# Patient Record
Sex: Female | Born: 1985 | Race: White | Hispanic: No | Marital: Married | Smoking: Former smoker
Health system: Southern US, Community
[De-identification: ages and names within clinical notes are randomized; demographics above are authoritative.]

## PROBLEM LIST (undated history)

## (undated) ENCOUNTER — Inpatient Hospital Stay (HOSPITAL_COMMUNITY): Payer: Self-pay

## (undated) DIAGNOSIS — N200 Calculus of kidney: Secondary | ICD-10-CM

## (undated) DIAGNOSIS — Z3490 Encounter for supervision of normal pregnancy, unspecified, unspecified trimester: Secondary | ICD-10-CM

## (undated) HISTORY — PX: NO PAST SURGERIES: SHX2092

## (undated) HISTORY — DX: Calculus of kidney: N20.0

## (undated) HISTORY — DX: Encounter for supervision of normal pregnancy, unspecified, unspecified trimester: Z34.90

---

## 2009-03-25 DIAGNOSIS — N200 Calculus of kidney: Secondary | ICD-10-CM

## 2009-03-25 HISTORY — DX: Calculus of kidney: N20.0

## 2010-05-03 ENCOUNTER — Emergency Department (HOSPITAL_COMMUNITY): Payer: Self-pay

## 2010-05-03 ENCOUNTER — Emergency Department (HOSPITAL_COMMUNITY)
Admission: EM | Admit: 2010-05-03 | Discharge: 2010-05-03 | Disposition: A | Discharge status: Home / Self Care | Payer: Self-pay | Attending: Emergency Medicine | Admitting: Emergency Medicine

## 2010-05-03 DIAGNOSIS — N201 Calculus of ureter: Secondary | ICD-10-CM | POA: Insufficient documentation

## 2010-05-03 DIAGNOSIS — R10813 Right lower quadrant abdominal tenderness: Secondary | ICD-10-CM | POA: Insufficient documentation

## 2010-05-03 DIAGNOSIS — R1031 Right lower quadrant pain: Secondary | ICD-10-CM | POA: Insufficient documentation

## 2010-05-03 DIAGNOSIS — R112 Nausea with vomiting, unspecified: Secondary | ICD-10-CM | POA: Insufficient documentation

## 2010-05-03 LAB — DIFFERENTIAL
Basophils Relative: 1 % (ref 0–1)
Lymphocytes Relative: 39 % (ref 12–46)
Monocytes Absolute: 0.5 10*3/uL (ref 0.1–1.0)
Monocytes Relative: 5 % (ref 3–12)
Neutro Abs: 5 10*3/uL (ref 1.7–7.7)

## 2010-05-03 LAB — URINALYSIS, ROUTINE W REFLEX MICROSCOPIC
Bilirubin Urine: NEGATIVE
Protein, ur: 30 mg/dL — AB
Urobilinogen, UA: 0.2 mg/dL (ref 0.0–1.0)

## 2010-05-03 LAB — BASIC METABOLIC PANEL
BUN: 11 mg/dL (ref 6–23)
CO2: 20 mEq/L (ref 19–32)
Calcium: 9.7 mg/dL (ref 8.4–10.5)
Creatinine, Ser: 0.87 mg/dL (ref 0.4–1.2)
Glucose, Bld: 95 mg/dL (ref 70–99)

## 2010-05-03 LAB — CBC
HCT: 40.6 % (ref 36.0–46.0)
Hemoglobin: 14.4 g/dL (ref 12.0–15.0)
MCH: 31.2 pg (ref 26.0–34.0)
MCHC: 35.5 g/dL (ref 30.0–36.0)
MCV: 88.1 fL (ref 78.0–100.0)

## 2010-05-03 LAB — PREGNANCY, URINE: Preg Test, Ur: NEGATIVE

## 2012-09-11 IMAGING — CT CT ABD-PELV W/O CM
2 of 4 series · 16 of 46 positions shown, 18 images · non-contrast
Comparison: None.

CLINICAL DATA: Abdominal pain, vomiting, rule out stone, rule out
appendicitis

CT ABDOMEN AND PELVIS WITHOUT CONTRAST
TECHNIQUE: Multidetector CT imaging of the abdomen and pelvis was
performed following the standard protocol without intravenous
contrast.

[Series 2: abd|pel w/o 5.0 b40f · axial · non-contrast · 0.66mm/px · z∈[-474,-98]mm · 13 of 83 slices shown, 15 images]
[im 4/83  soft-tissue]
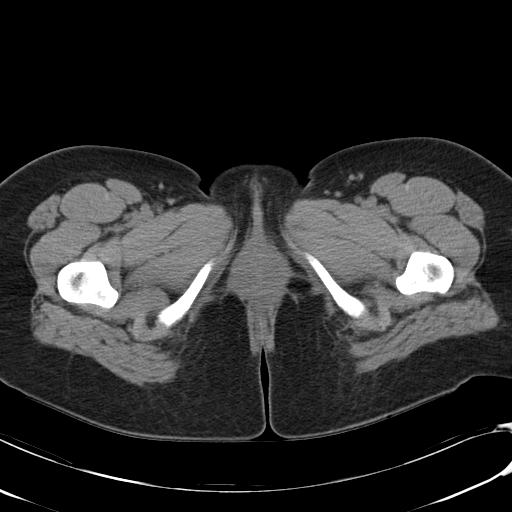
[im 4/83  bone]
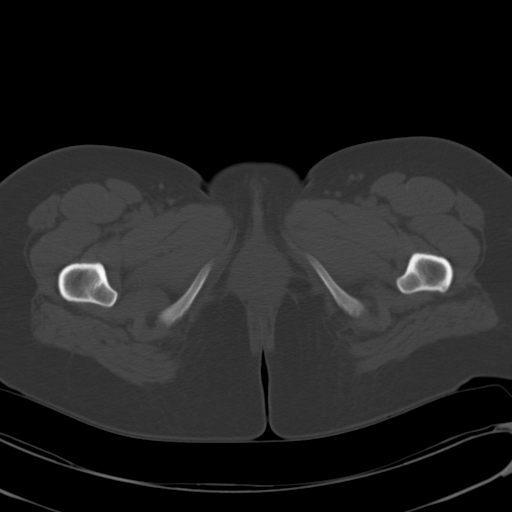
[im 10/83  soft-tissue]
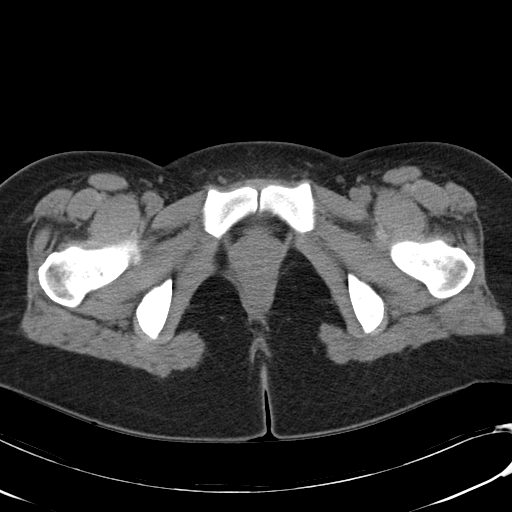
[im 17/83  soft-tissue]
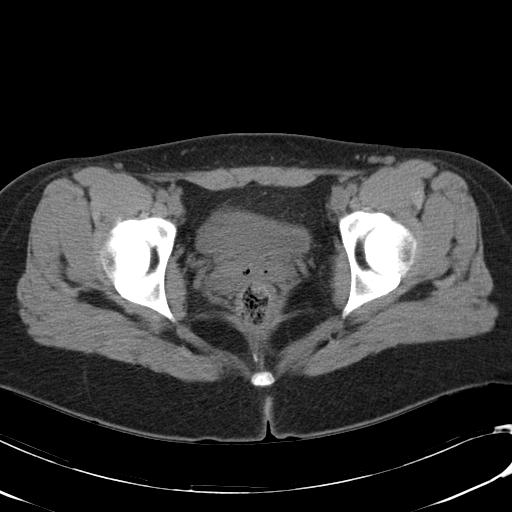
[im 23/83  soft-tissue]
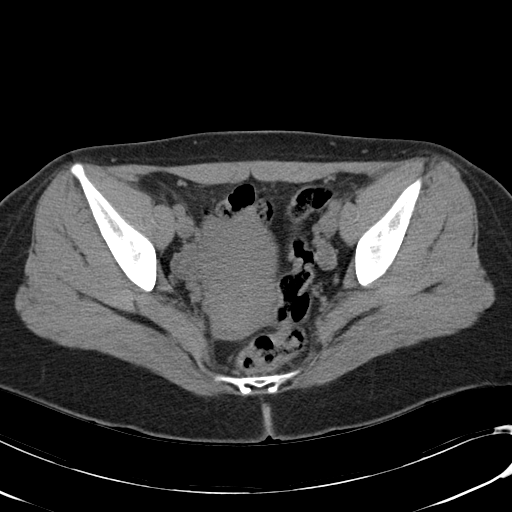
[im 30/83  soft-tissue]
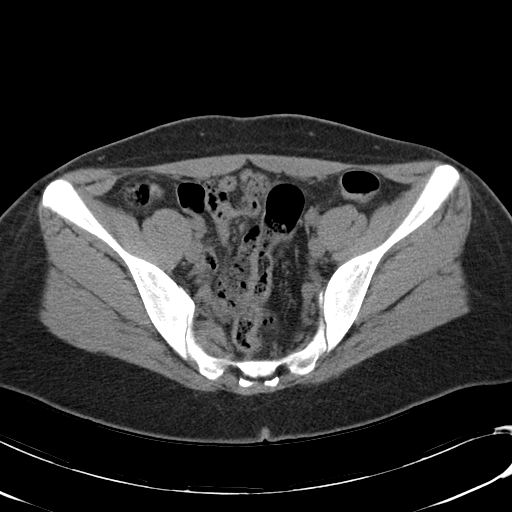
[im 37/83  soft-tissue]
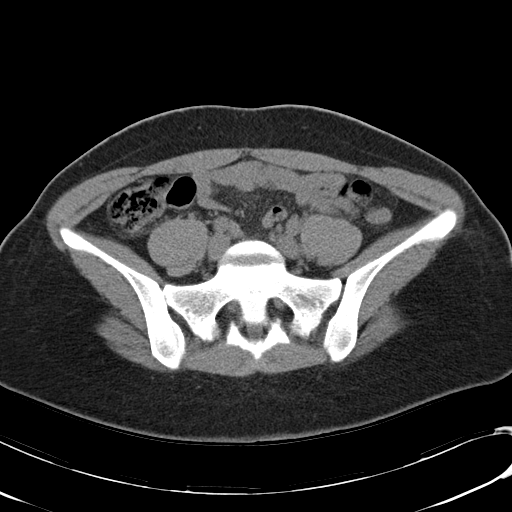
[im 43/83  soft-tissue]
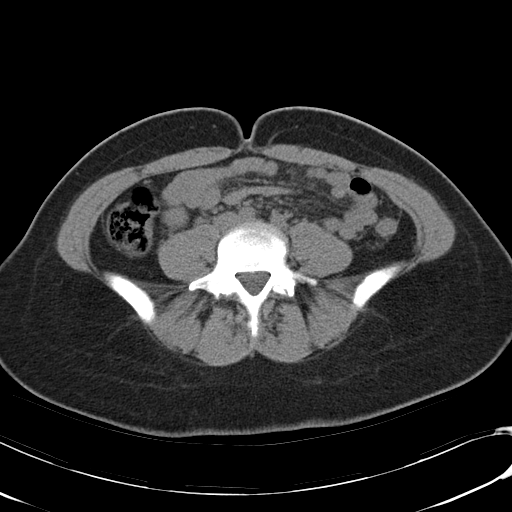
[im 46/83  soft-tissue]
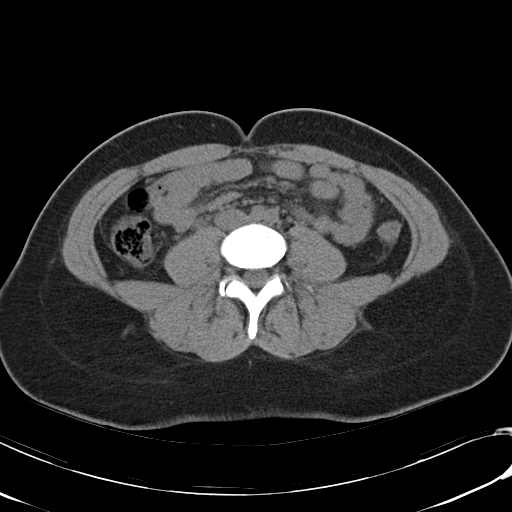
[im 53/83  soft-tissue]
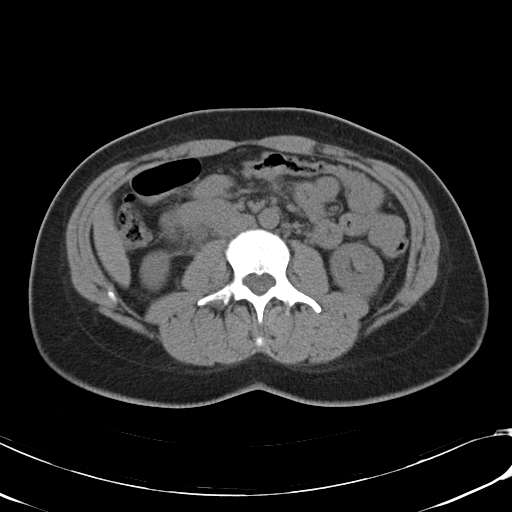
[im 53/83  bone]
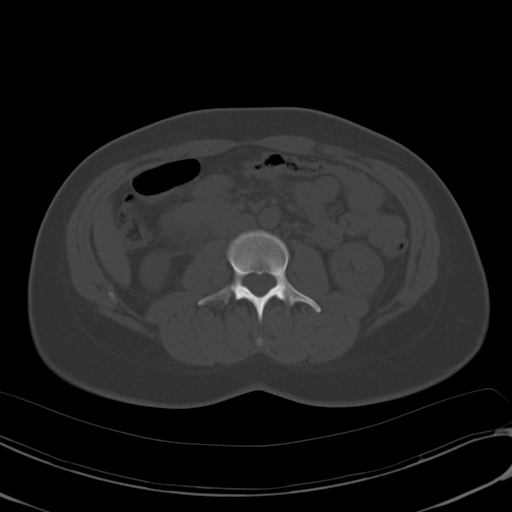
[im 60/83  soft-tissue]
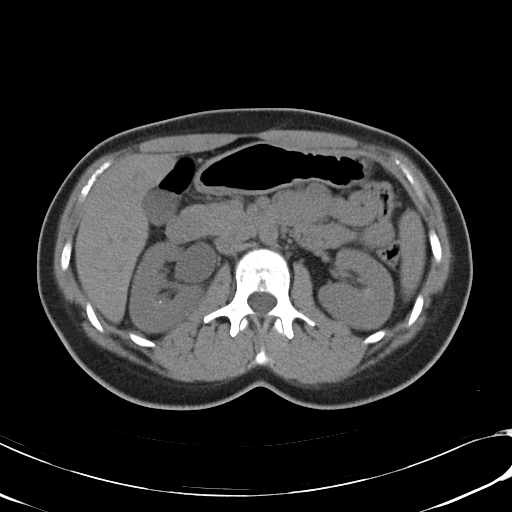
[im 66/83  soft-tissue]
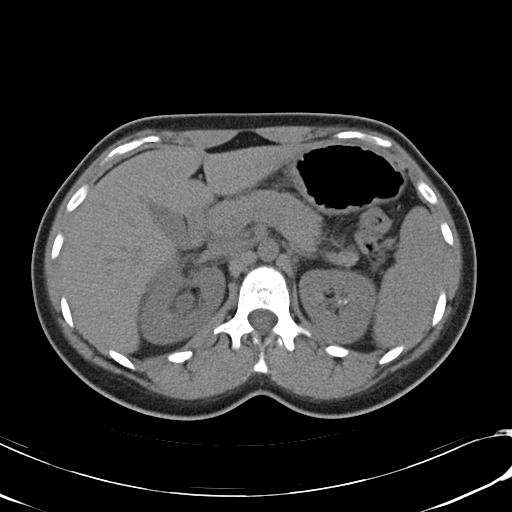
[im 73/83  soft-tissue]
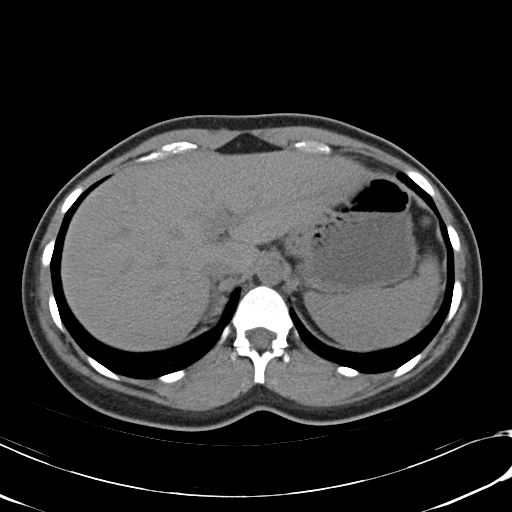
[im 79/83  soft-tissue]
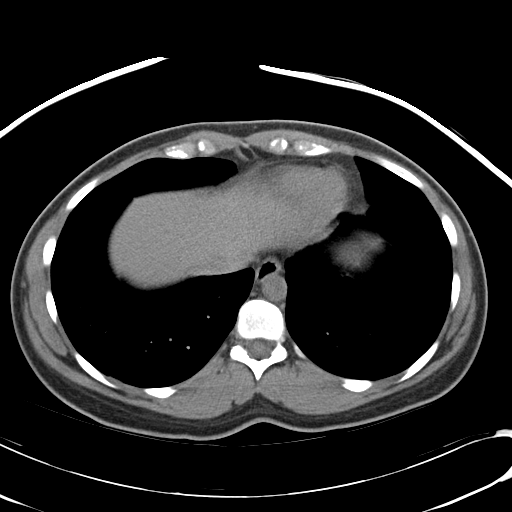

[Series 4: mpr cor (id) · coronal · 0.77mm/px · 3 of 67 slices shown]
[im 23/67  soft-tissue]
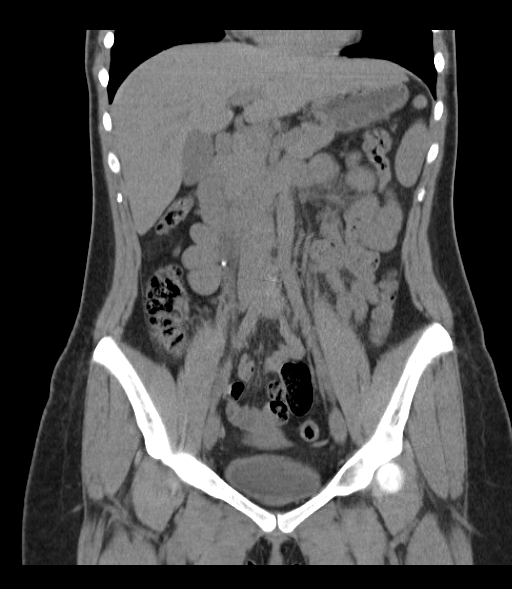
[im 30/67  soft-tissue]
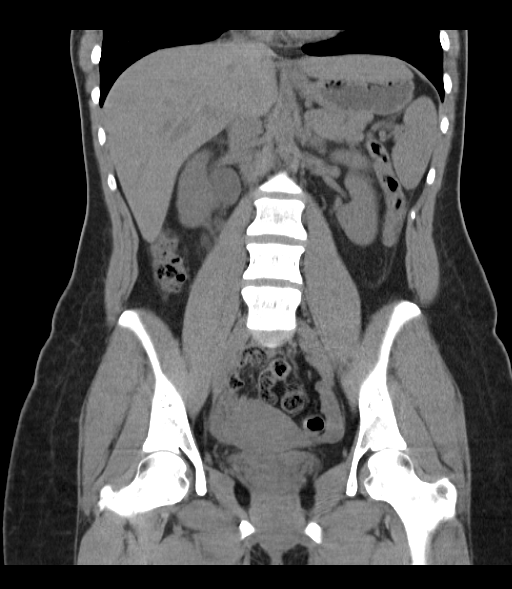
[im 37/67  soft-tissue]
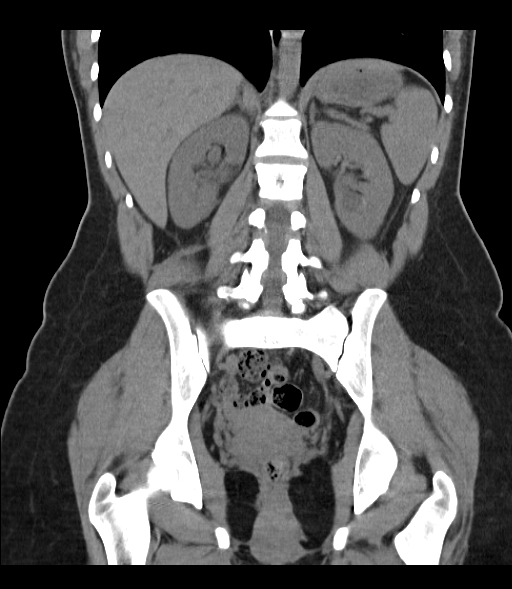

[16 of 46 positions shown; findings below may reference images not displayed]

FINDINGS: Lung bases are unremarkable.

No calcified gallstones are noted within gallbladder.

Sagittal images of the spine are unremarkable.

Unenhanced liver, spleen, pancreas and adrenals are unremarkable.
Multiple punctate nonobstructive right renal calculi are noted the
largest in mid pole measures 2 mm.  At least  two nonobstructive
calcified calculi are noted in the upper pole of the left kidney
the largest measures 1.9 mm.

There is mild right hydronephrosis and proximal right hydroureter.
There is mild right periureteral stranding.  In axial image 37
there is a 5 mm calcified obstructive calculus in mid right ureter
at the level of the mid aspect of L4 vertebral body.

Bilateral distal ureter is unremarkable.

No small bowel obstruction.  No ascites or free air.  No
adenopathy.

Normal appendix is clearly visualized in axial image 56.  The
appendix has a anterior position in right pelvis.

The unenhanced uterus and adnexa are unremarkable.  The urinary
bladder is under distended grossly unremarkable.  No destructive
bony lesions are noted within pelvis.  No pelvic ascites or
adenopathy.
IMPRESSION: 1.  Bilateral nonobstructive nephrolithiasis.  Mild right
hydronephrosis and proximal right hydroureter.
2.  There is  5 mm calcified obstructive calculus in mid right
ureter at L4 vertebral body level.
3.  Normal appendix is clearly visualized.
4.  Unremarkable distal ureter and urinary bladder.

## 2015-08-01 ENCOUNTER — Encounter: Payer: Self-pay | Admitting: Adult Health

## 2015-08-01 ENCOUNTER — Ambulatory Visit (INDEPENDENT_AMBULATORY_CARE_PROVIDER_SITE_OTHER): Payer: BLUE CROSS/BLUE SHIELD | Admitting: Adult Health

## 2015-08-01 VITALS — BP 100/60 | HR 70 | Ht 62.0 in | Wt 170.0 lb

## 2015-08-01 DIAGNOSIS — Z3201 Encounter for pregnancy test, result positive: Secondary | ICD-10-CM | POA: Diagnosis not present

## 2015-08-01 DIAGNOSIS — Z3490 Encounter for supervision of normal pregnancy, unspecified, unspecified trimester: Secondary | ICD-10-CM

## 2015-08-01 DIAGNOSIS — N926 Irregular menstruation, unspecified: Secondary | ICD-10-CM

## 2015-08-01 DIAGNOSIS — O3680X0 Pregnancy with inconclusive fetal viability, not applicable or unspecified: Secondary | ICD-10-CM

## 2015-08-01 HISTORY — DX: Encounter for supervision of normal pregnancy, unspecified, unspecified trimester: Z34.90

## 2015-08-01 LAB — POCT URINE PREGNANCY: Preg Test, Ur: POSITIVE — AB

## 2015-08-01 MED ORDER — PRENATAL PLUS 27-1 MG PO TABS
1.0000 | ORAL_TABLET | Freq: Every day | ORAL | Status: DC
Start: 2015-08-01 — End: 2016-04-07

## 2015-08-01 NOTE — Progress Notes (Signed)
Subjective:     Patient ID: Anne Marshall, female   DOB: 04/29/1985, 30 y.o.   MRN: [id number]  HPI Anne Marshall is a 30 year old white female, married in for UPT, has missed a period and had +HPT, has some nausea today, denies any bleeding or vomiting or pain, has + breast tenderness, she has 29 year old daughter at home.  Review of Systems Patient denies any headaches, hearing loss, fatigue, blurred vision, shortness of breath, chest pain, abdominal pain, problems with bowel movements, urination, or intercourse. No joint pain or mood swings. See HPI for positives. Reviewed past medical,surgical, social and family history. Reviewed medications and allergies.     Objective:   Physical Exam BP 100/60 mmHg  Pulse 70  Ht 5\' 2"  (1.575 m)  Wt 170 lb (77.111 kg)  BMI 31.09 kg/m2  LMP 06/22/2015 UPT +, about 5+[redacted] weeks pregnant by LMP with EDD 03/28/16, Skin warm and dry. Neck: mid line trachea, normal thyroid, good ROM, no lymphadenopathy noted. Lungs: clear to ausculation bilaterally. Cardiovascular: regular rate and rhythm. Abdomen is soft and non tender, no HSM.Medicaid form given.    Assessment:     UPT + Pregnant     Plan:    Eat often Rx prenatal plus #30 take 1 daily with 11 refills Return in 9 days for dating US Review handout on first trimester

## 2015-08-01 NOTE — Patient Instructions (Signed)
First Trimester of Pregnancy The first trimester of pregnancy is from week 1 until the end of week 12 (months 1 through 3). A week after a sperm fertilizes an egg, the egg will implant on the wall of the uterus. This embryo will begin to develop into a baby. Genes from you and your partner are forming the baby. The female genes determine whether the baby is a boy or a girl. At 6-8 weeks, the eyes and face are formed, and the heartbeat can be seen on ultrasound. At the end of 12 weeks, all the baby's organs are formed.  Now that you are pregnant, you will want to do everything you can to have a healthy baby. Two of the most important things are to get good prenatal care and to follow your health care provider's instructions. Prenatal care is all the medical care you receive before the baby's birth. This care will help prevent, find, and treat any problems during the pregnancy and childbirth. BODY CHANGES Your body goes through many changes during pregnancy. The changes vary from woman to woman.   You may gain or lose a couple of pounds at first.  You may feel sick to your stomach (nauseous) and throw up (vomit). If the vomiting is uncontrollable, call your health care provider.  You may tire easily.  You may develop headaches that can be relieved by medicines approved by your health care provider.  You may urinate more often. Painful urination may mean you have a bladder infection.  You may develop heartburn as a result of your pregnancy.  You may develop constipation because certain hormones are causing the muscles that push waste through your intestines to slow down.  You may develop hemorrhoids or swollen, bulging veins (varicose veins).  Your breasts may begin to grow larger and become tender. Your nipples may stick out more, and the tissue that surrounds them (areola) may become darker.  Your gums may bleed and may be sensitive to brushing and flossing.  Dark spots or blotches (chloasma,  mask of pregnancy) may develop on your face. This will likely fade after the baby is born.  Your menstrual periods will stop.  You may have a loss of appetite.  You may develop cravings for certain kinds of food.  You may have changes in your emotions from day to day, such as being excited to be pregnant or being concerned that something may go wrong with the pregnancy and baby.  You may have more vivid and strange dreams.  You may have changes in your hair. These can include thickening of your hair, rapid growth, and changes in texture. Some women also have hair loss during or after pregnancy, or hair that feels dry or thin. Your hair will most likely return to normal after your baby is born. WHAT TO EXPECT AT YOUR PRENATAL VISITS During a routine prenatal visit:  You will be weighed to make sure you and the baby are growing normally.  Your blood pressure will be taken.  Your abdomen will be measured to track your baby's growth.  The fetal heartbeat will be listened to starting around week 10 or 12 of your pregnancy.  Test results from any previous visits will be discussed. Your health care provider may ask you:  How you are feeling.  If you are feeling the baby move.  If you have had any abnormal symptoms, such as leaking fluid, bleeding, severe headaches, or abdominal cramping.  If you are using any tobacco products,   including cigarettes, chewing tobacco, and electronic cigarettes.  If you have any questions. Other tests that may be performed during your first trimester include:  Blood tests to find your blood type and to check for the presence of any previous infections. They will also be used to check for low iron levels (anemia) and Rh antibodies. Later in the pregnancy, blood tests for diabetes will be done along with other tests if problems develop.  Urine tests to check for infections, diabetes, or protein in the urine.  An ultrasound to confirm the proper growth  and development of the baby.  An amniocentesis to check for possible genetic problems.  Fetal screens for spina bifida and Down syndrome.  You may need other tests to make sure you and the baby are doing well.  HIV (human immunodeficiency virus) testing. Routine prenatal testing includes screening for HIV, unless you choose not to have this test. HOME CARE INSTRUCTIONS  Medicines  Follow your health care provider's instructions regarding medicine use. Specific medicines may be either safe or unsafe to take during pregnancy.  Take your prenatal vitamins as directed.  If you develop constipation, try taking a stool softener if your health care provider approves. Diet  Eat regular, well-balanced meals. Choose a variety of foods, such as meat or vegetable-based protein, fish, milk and low-fat dairy products, vegetables, fruits, and whole grain breads and cereals. Your health care provider will help you determine the amount of weight gain that is right for you.  Avoid raw meat and uncooked cheese. These carry germs that can cause birth defects in the baby.  Eating four or five small meals rather than three large meals a day may help relieve nausea and vomiting. If you start to feel nauseous, eating a few soda crackers can be helpful. Drinking liquids between meals instead of during meals also seems to help nausea and vomiting.  If you develop constipation, eat more high-fiber foods, such as fresh vegetables or fruit and whole grains. Drink enough fluids to keep your urine clear or pale yellow. Activity and Exercise  Exercise only as directed by your health care provider. Exercising will help you:  Control your weight.  Stay in shape.  Be prepared for labor and delivery.  Experiencing pain or cramping in the lower abdomen or low back is a good sign that you should stop exercising. Check with your health care provider before continuing normal exercises.  Try to avoid standing for long  periods of time. Move your legs often if you must stand in one place for a long time.  Avoid heavy lifting.  Wear low-heeled shoes, and practice good posture.  You may continue to have sex unless your health care provider directs you otherwise. Relief of Pain or Discomfort  Wear a good support bra for breast tenderness.   Take warm sitz baths to soothe any pain or discomfort caused by hemorrhoids. Use hemorrhoid cream if your health care provider approves.   Rest with your legs elevated if you have leg cramps or low back pain.  If you develop varicose veins in your legs, wear support hose. Elevate your feet for 15 minutes, 3-4 times a day. Limit salt in your diet. Prenatal Care  Schedule your prenatal visits by the twelfth week of pregnancy. They are usually scheduled monthly at first, then more often in the last 2 months before delivery.  Write down your questions. Take them to your prenatal visits.  Keep all your prenatal visits as directed by your   health care provider. Safety  Wear your seat belt at all times when driving.  Make a list of emergency phone numbers, including numbers for family, friends, the hospital, and police and fire departments. General Tips  Ask your health care provider for a referral to a local prenatal education class. Begin classes no later than at the beginning of month 6 of your pregnancy.  Ask for help if you have counseling or nutritional needs during pregnancy. Your health care provider can offer advice or refer you to specialists for help with various needs.  Do not use hot tubs, steam rooms, or saunas.  Do not douche or use tampons or scented sanitary pads.  Do not cross your legs for long periods of time.  Avoid cat litter boxes and soil used by cats. These carry germs that can cause birth defects in the baby and possibly loss of the fetus by miscarriage or stillbirth.  Avoid all smoking, herbs, alcohol, and medicines not prescribed by  your health care provider. Chemicals in these affect the formation and growth of the baby.  Do not use any tobacco products, including cigarettes, chewing tobacco, and electronic cigarettes. If you need help quitting, ask your health care provider. You may receive counseling support and other resources to help you quit.  Schedule a dentist appointment. At home, brush your teeth with a soft toothbrush and be gentle when you floss. SEEK MEDICAL CARE IF:   You have dizziness.  You have mild pelvic cramps, pelvic pressure, or nagging pain in the abdominal area.  You have persistent nausea, vomiting, or diarrhea.  You have a bad smelling vaginal discharge.  You have pain with urination.  You notice increased swelling in your face, hands, legs, or ankles. SEEK IMMEDIATE MEDICAL CARE IF:   You have a fever.  You are leaking fluid from your vagina.  You have spotting or bleeding from your vagina.  You have severe abdominal cramping or pain.  You have rapid weight gain or loss.  You vomit blood or material that looks like coffee grounds.  You are exposed to [place] measles and have never had them.  You are exposed to fifth disease or chickenpox.  You develop a severe headache.  You have shortness of breath.  You have any kind of trauma, such as from a fall or a car accident.   This information is not intended to replace advice given to you by your health care provider. Make sure you discuss any questions you have with your health care provider.   Document Released: 03/05/2001 Document Revised: 04/01/2014 Document Reviewed: 01/19/2013 Elsevier Interactive Patient Education [employer]2016 Elsevier Inc. Return in 9 days for dating Eat often

## 2015-08-10 ENCOUNTER — Ambulatory Visit (INDEPENDENT_AMBULATORY_CARE_PROVIDER_SITE_OTHER): Payer: BLUE CROSS/BLUE SHIELD

## 2015-08-10 DIAGNOSIS — O3680X0 Pregnancy with inconclusive fetal viability, not applicable or unspecified: Secondary | ICD-10-CM | POA: Diagnosis not present

## 2015-08-10 DIAGNOSIS — Z3A01 Less than 8 weeks gestation of pregnancy: Secondary | ICD-10-CM

## 2015-08-10 NOTE — Progress Notes (Signed)
US 7 wks,single IUP w/ys,pos fht 116 bpm,normal rt ov,simple lt corpus luteal cyst 2.1 x 1.9 x 1.6 cm

## 2015-08-23 ENCOUNTER — Encounter: Payer: BLUE CROSS/BLUE SHIELD | Admitting: Advanced Practice Midwife

## 2015-09-06 ENCOUNTER — Ambulatory Visit (INDEPENDENT_AMBULATORY_CARE_PROVIDER_SITE_OTHER): Payer: BLUE CROSS/BLUE SHIELD | Admitting: Advanced Practice Midwife

## 2015-09-06 ENCOUNTER — Encounter: Payer: Self-pay | Admitting: Advanced Practice Midwife

## 2015-09-06 VITALS — BP 90/60 | HR 80 | Wt 170.0 lb

## 2015-09-06 DIAGNOSIS — Z3491 Encounter for supervision of normal pregnancy, unspecified, first trimester: Secondary | ICD-10-CM

## 2015-09-06 DIAGNOSIS — Z331 Pregnant state, incidental: Secondary | ICD-10-CM

## 2015-09-06 DIAGNOSIS — Z029 Encounter for administrative examinations, unspecified: Secondary | ICD-10-CM

## 2015-09-06 DIAGNOSIS — Z0283 Encounter for blood-alcohol and blood-drug test: Secondary | ICD-10-CM

## 2015-09-06 DIAGNOSIS — Z369 Encounter for antenatal screening, unspecified: Secondary | ICD-10-CM

## 2015-09-06 DIAGNOSIS — Z1389 Encounter for screening for other disorder: Secondary | ICD-10-CM

## 2015-09-06 DIAGNOSIS — Z3490 Encounter for supervision of normal pregnancy, unspecified, unspecified trimester: Secondary | ICD-10-CM | POA: Insufficient documentation

## 2015-09-06 LAB — POCT URINALYSIS DIPSTICK
GLUCOSE UA: NEGATIVE
KETONES UA: NEGATIVE
Leukocytes, UA: NEGATIVE
NITRITE UA: NEGATIVE

## 2015-09-06 NOTE — Progress Notes (Signed)
  Subjective:    Anne Marshall is a G2P1 7263w6d being seen today for her first obstetrical visit.  Her obstetrical history is significant for term SVD w/o complications.  Pregnancy history fully reviewed. She had elective IOL.  Patient reports no complaints.  Filed Vitals:   09/06/15 0857  BP: 90/60  Pulse: 80  Weight: 170 lb (77.111 kg)    HISTORY: OB History  Gravida Para Term Preterm AB SAB TAB Ectopic Multiple Living  2 1        1     # Outcome Date GA Lbr Len/2nd Weight Sex Delivery Anes PTL Lv  2 Current           1 Para              Past Medical History  Diagnosis Date  . Pregnant 08/01/2015   History reviewed. No pertinent past surgical history. Family History  Problem Relation Age of Onset  . Diabetes Father      Exam                                      System:     Skin: normal coloration and turgor, no rashes    Neurologic: oriented, normal, normal mood   Extremities: normal strength, tone, and muscle mass   HEENT PERRLA   Mouth/Teeth mucous membranes moist, poor dentition   Neck supple and no masses   Cardiovascular: regular rate and rhythm   Respiratory:  appears well, vitals normal, no respiratory distress, acyanotic   Abdomen: soft, non-tender;  FHR: 150          Assessment:    Pregnancy: G2P1 Patient Active Problem List   Diagnosis Date Noted  .    09/06/2015  . Pregnant 08/01/2015        Plan:     Initial labs drawn. Continue prenatal vitamins  Problem list reviewed and updated  Reviewed n/v relief measures and warning s/s to report  Reviewed recommended weight gain based on pre-gravid BMI  Encouraged well-balanced diet Genetic Screening discussed Integrated Screen: requested.  Ultrasound discussed; fetal survey: requested.  Return in about 2 weeks (around 09/20/2015) for LROB, US:NT+1st IT.  CRESENZO-DISHMAN,Sigmond Patalano 09/06/2015

## 2015-09-07 LAB — URINE CULTURE

## 2015-09-08 LAB — PMP SCREEN PROFILE (10S), URINE
AMPHETAMINE SCRN UR: NEGATIVE ng/mL
Barbiturate Screen, Ur: NEGATIVE ng/mL
Benzodiazepine Screen, Urine: NEGATIVE ng/mL
COCAINE(METAB.) SCREEN, URINE: NEGATIVE ng/mL
CREATININE(CRT), U: 225.9 mg/dL (ref 20.0–300.0)
Cannabinoids Ur Ql Scn: NEGATIVE ng/mL
METHADONE SCREEN, URINE: NEGATIVE ng/mL
OPIATE SCRN UR: NEGATIVE ng/mL
OXYCODONE+OXYMORPHONE UR QL SCN: NEGATIVE ng/mL
PCP SCRN UR: NEGATIVE ng/mL
PROPOXYPHENE SCREEN: NEGATIVE ng/mL
Ph of Urine: 5.8 (ref 4.5–8.9)

## 2015-09-08 LAB — HEPATITIS B SURFACE ANTIGEN: Hepatitis B Surface Ag: NEGATIVE

## 2015-09-08 LAB — RUBELLA SCREEN: Rubella Antibodies, IGG: 1.08 index (ref >0.99)

## 2015-09-08 LAB — CBC
HEMOGLOBIN: 11.9 g/dL (ref 11.1–15.9)
Hematocrit: 35.5 % (ref 34.0–46.6)
MCH: 29.4 pg (ref 26.6–33.0)
MCHC: 33.5 g/dL (ref 31.5–35.7)
MCV: 88 fL (ref 79–97)
Platelets: 244 10*3/uL (ref 150–379)
RBC: 4.05 x10E6/uL (ref 3.77–5.28)
RDW: 14.2 % (ref 12.3–15.4)
WBC: 9.8 10*3/uL (ref 3.4–10.8)

## 2015-09-08 LAB — URINALYSIS, ROUTINE W REFLEX MICROSCOPIC
Bilirubin, UA: NEGATIVE
GLUCOSE, UA: NEGATIVE
Ketones, UA: NEGATIVE
LEUKOCYTES UA: NEGATIVE
Nitrite, UA: NEGATIVE
PROTEIN UA: NEGATIVE
RBC, UA: NEGATIVE
SPEC GRAV UA: 1.026 (ref 1.005–1.030)
Urobilinogen, Ur: 0.2 mg/dL (ref 0.2–1.0)
pH, UA: 6 (ref 5.0–7.5)

## 2015-09-08 LAB — ABO/RH: RH TYPE: POSITIVE

## 2015-09-08 LAB — HIV ANTIBODY (ROUTINE TESTING W REFLEX): HIV Screen 4th Generation wRfx: NONREACTIVE

## 2015-09-08 LAB — ANTIBODY SCREEN: Antibody Screen: NEGATIVE

## 2015-09-08 LAB — VARICELLA ZOSTER ANTIBODY, IGG: Varicella zoster IgG: 436 index (ref >165)

## 2015-09-08 LAB — RPR: RPR Ser Ql: NONREACTIVE

## 2015-09-19 ENCOUNTER — Other Ambulatory Visit: Payer: Self-pay | Admitting: Advanced Practice Midwife

## 2015-09-19 DIAGNOSIS — Z3682 Encounter for antenatal screening for nuchal translucency: Secondary | ICD-10-CM

## 2015-09-20 ENCOUNTER — Ambulatory Visit (INDEPENDENT_AMBULATORY_CARE_PROVIDER_SITE_OTHER): Payer: BLUE CROSS/BLUE SHIELD

## 2015-09-20 ENCOUNTER — Ambulatory Visit (INDEPENDENT_AMBULATORY_CARE_PROVIDER_SITE_OTHER): Payer: BLUE CROSS/BLUE SHIELD | Admitting: Advanced Practice Midwife

## 2015-09-20 VITALS — BP 104/68 | HR 68 | Wt 168.5 lb

## 2015-09-20 DIAGNOSIS — Z3682 Encounter for antenatal screening for nuchal translucency: Secondary | ICD-10-CM

## 2015-09-20 DIAGNOSIS — Z331 Pregnant state, incidental: Secondary | ICD-10-CM

## 2015-09-20 DIAGNOSIS — Z3A13 13 weeks gestation of pregnancy: Secondary | ICD-10-CM

## 2015-09-20 DIAGNOSIS — Z1389 Encounter for screening for other disorder: Secondary | ICD-10-CM

## 2015-09-20 DIAGNOSIS — Z36 Encounter for antenatal screening of mother: Secondary | ICD-10-CM

## 2015-09-20 DIAGNOSIS — Z3491 Encounter for supervision of normal pregnancy, unspecified, first trimester: Secondary | ICD-10-CM

## 2015-09-20 DIAGNOSIS — Z3490 Encounter for supervision of normal pregnancy, unspecified, unspecified trimester: Secondary | ICD-10-CM

## 2015-09-20 DIAGNOSIS — Z3481 Encounter for supervision of other normal pregnancy, first trimester: Secondary | ICD-10-CM

## 2015-09-20 LAB — POCT URINALYSIS DIPSTICK
GLUCOSE UA: NEGATIVE
Ketones, UA: NEGATIVE
Nitrite, UA: NEGATIVE
RBC UA: NEGATIVE

## 2015-09-20 NOTE — Progress Notes (Signed)
US 12+6 wks,measurements c/w dates,normal ov's bilat,fhr 161 bpm,post pl gr 0,NB present,NT 2.1 mm,crl 62.6 mm

## 2015-09-20 NOTE — Progress Notes (Signed)
G2P1001 395w6d Estimated Date of Delivery: 03/28/16  Blood pressure 104/68, pulse 68, weight 168 lb 8 oz (76.431 kg), last menstrual period 06/22/2015.   BP weight and urine results all reviewed and noted.  Please refer to the obstetrical flow sheet for the fundal height and fetal heart rate documentation: US 12+6 wks,measurements c/w dates,normal ov's bilat,fhr 161 bpm,post pl gr 0,NB present,NT 2.1 mm,crl 62.6 mm           Patient denies any bleeding and no rupture of membranes symptoms or regular contractions. Patient is without complaints. Still has HAs, "I've always had them."  All questions were answered.  Orders Placed This Encounter  Procedures  . Maternal Screen, Integrated #1  . POCT urinalysis dipstick    Plan:  Continued routine obstetrical care,   Return in about 4 weeks (around 10/18/2015) for 2nd IT, LROB.

## 2015-09-20 NOTE — Patient Instructions (Signed)
General Headache Without Cause A headache is pain or discomfort felt around the head or neck area. The specific cause of a headache may not be found. There are many causes and types of headaches. A few common ones are:  Tension headaches.  Migraine headaches.  Cluster headaches.  Chronic daily headaches. HOME CARE INSTRUCTIONS  Watch your condition for any changes. Take these steps to help with your condition: Managing Pain  Take over-the-counter and prescription medicines only as told by your health care provider.  Lie down in a dark, quiet room when you have a headache.  If directed, apply ice to the head and neck area:  Put ice in a plastic bag.  Place a towel between your skin and the bag.  Leave the ice on for 20 minutes, 2-3 times per day.  Use a heating pad or hot shower to apply heat to the head and neck area as told by your health care provider.  Keep lights dim if bright lights bother you or make your headaches worse. Eating and Drinking  Eat meals on a regular schedule.  Limit alcohol use.  Decrease the amount of caffeine you drink, or stop drinking caffeine. General Instructions  Keep all follow-up visits as told by your health care provider. This is important.  Keep a headache journal to help find out what may trigger your headaches. For example, write down:  What you eat and drink.  How much sleep you get.  Any change to your diet or medicines.  Try massage or other relaxation techniques.  Limit stress.  Sit up straight, and do not tense your muscles.  Do not use tobacco products, including cigarettes, chewing tobacco, or e-cigarettes. If you need help quitting, ask your health care provider.  Exercise regularly as told by your health care provider.  Sleep on a regular schedule. Get 7-9 hours of sleep, or the amount recommended by your health care provider. SEEK MEDICAL CARE IF:   You have a headache that is different from the usual  headache.  You have nausea or you vomit.  You have a fever. SEEK IMMEDIATE MEDICAL CARE IF:   Your headache becomes severe.  You have repeated vomiting.  You have a stiff neck.  You have a loss of vision.  You have problems with speech.  You have pain in the eye or ear.  You have muscular weakness or loss of muscle control.  You lose your balance or have trouble walking.  You feel faint or pass out.  You have confusion.   This information is not intended to replace advice given to you by your health care provider. Make sure you discuss any questions you have with your health care provider.   Document Released: 03/11/2005 Document Revised: 11/30/2014 Document Reviewed: 07/04/2014 Elsevier Interactive Patient Education [employer]2016 Elsevier Inc.

## 2015-09-23 LAB — MATERNAL SCREEN, INTEGRATED #1
CROWN RUMP LENGTH MAT SCREEN: 62.6 mm
GEST. AGE ON COLLECTION DATE: 12.6 wk
Maternal Age at EDD: 29.5 years
Nuchal Translucency (NT): 2.1 mm
Number of Fetuses: 1
PAPP-A Value: 418.6 ng/mL
WEIGHT: 168 [lb_av]

## 2015-10-03 ENCOUNTER — Ambulatory Visit (INDEPENDENT_AMBULATORY_CARE_PROVIDER_SITE_OTHER): Payer: BLUE CROSS/BLUE SHIELD | Admitting: Advanced Practice Midwife

## 2015-10-03 ENCOUNTER — Telehealth: Payer: Self-pay | Admitting: *Deleted

## 2015-10-03 VITALS — BP 100/50 | HR 72 | Wt 171.2 lb

## 2015-10-03 DIAGNOSIS — Z3A15 15 weeks gestation of pregnancy: Secondary | ICD-10-CM

## 2015-10-03 DIAGNOSIS — Z1389 Encounter for screening for other disorder: Secondary | ICD-10-CM

## 2015-10-03 DIAGNOSIS — R51 Headache: Secondary | ICD-10-CM

## 2015-10-03 DIAGNOSIS — Z3481 Encounter for supervision of other normal pregnancy, first trimester: Secondary | ICD-10-CM

## 2015-10-03 DIAGNOSIS — R42 Dizziness and giddiness: Secondary | ICD-10-CM

## 2015-10-03 DIAGNOSIS — Z3491 Encounter for supervision of normal pregnancy, unspecified, first trimester: Secondary | ICD-10-CM

## 2015-10-03 DIAGNOSIS — Z331 Pregnant state, incidental: Secondary | ICD-10-CM

## 2015-10-03 DIAGNOSIS — Z113 Encounter for screening for infections with a predominantly sexual mode of transmission: Secondary | ICD-10-CM

## 2015-10-03 LAB — POCT URINALYSIS DIPSTICK
Glucose, UA: NEGATIVE
KETONES UA: NEGATIVE
NITRITE UA: NEGATIVE
RBC UA: NEGATIVE

## 2015-10-03 LAB — GLUCOSE, POCT (MANUAL RESULT ENTRY): POC Glucose: 86 mg/dl (ref 70–99)

## 2015-10-03 MED ORDER — BUTALBITAL-APAP-CAFFEINE 50-325-40 MG PO TABS: 1.0000 | ORAL_TABLET | Freq: Four times a day (QID) | ORAL | Status: DC | PRN

## 2015-10-03 NOTE — Telephone Encounter (Signed)
Pt called complaining of bad headaches. Tylenol dulls the pain but don't take pain away. Pt had dizziness and lightheaded last pm. I advised pt needs to be seen. Pt to be seen at 2:30 with Drenda FreezeFran, CNM. JSY

## 2015-10-03 NOTE — Progress Notes (Signed)
WORKIN OB FOR HA FOR A WEEK  HA on left side of head. Has hx of HAs, but this one feels different in that tylenonl hasnt' helped.  Responded well to accupressure here in office on occipital bones.  Rx for Fioricet given, along with some other tips to prevent.  Feels as if dizziness/lightheadedness accompanies the HA, not independent.    F/U as scheduled in 2 weeks.

## 2015-10-03 NOTE — Patient Instructions (Signed)
For Headaches:   Stay well hydrated, drink enough water so that your urine is clear, sometimes if you are dehydrated you can get headaches  Eat small frequent meals and snacks, sometimes if you are hungry you can get headaches  Sometimes you get headaches during pregnancy from the pregnancy hormones  You can try tylenol (1-2 regular strength 325mg or 1-2 extra strength 500mg) as directed on the box. The least amount of medication that works is best.   Cool compresses (cool wet washcloth or ice pack) to area of head that is hurting  You can also try drinking a caffeinated drink to see if this will help  If not helping, try below:  For Prevention of Headaches/Migraines:  CoQ10 100mg three times daily  Vitamin B2 400mg daily  Magnesium Oxide 400-600mg daily  If You Get a Bad Headache/Migraine:  Benadryl 25mg   Magnesium Oxide  1 large Gatorade  2 extra strength Tylenol (1,000mg total)  1 cup coffee or Coke  If this doesn't help please call us @ [phone]   

## 2015-10-05 LAB — GC/CHLAMYDIA PROBE AMP
Chlamydia trachomatis, NAA: NEGATIVE
NEISSERIA GONORRHOEAE BY PCR: NEGATIVE

## 2015-10-18 ENCOUNTER — Ambulatory Visit (INDEPENDENT_AMBULATORY_CARE_PROVIDER_SITE_OTHER): Payer: BLUE CROSS/BLUE SHIELD | Admitting: Advanced Practice Midwife

## 2015-10-18 VITALS — BP 108/64 | HR 64 | Wt 174.0 lb

## 2015-10-18 DIAGNOSIS — Z3492 Encounter for supervision of normal pregnancy, unspecified, second trimester: Secondary | ICD-10-CM

## 2015-10-18 DIAGNOSIS — Z3482 Encounter for supervision of other normal pregnancy, second trimester: Secondary | ICD-10-CM

## 2015-10-18 DIAGNOSIS — Z1389 Encounter for screening for other disorder: Secondary | ICD-10-CM

## 2015-10-18 DIAGNOSIS — Z3A17 17 weeks gestation of pregnancy: Secondary | ICD-10-CM

## 2015-10-18 DIAGNOSIS — Z363 Encounter for antenatal screening for malformations: Secondary | ICD-10-CM

## 2015-10-18 DIAGNOSIS — Z3682 Encounter for antenatal screening for nuchal translucency: Secondary | ICD-10-CM

## 2015-10-18 DIAGNOSIS — Z331 Pregnant state, incidental: Secondary | ICD-10-CM

## 2015-10-18 LAB — POCT URINALYSIS DIPSTICK
Blood, UA: NEGATIVE
GLUCOSE UA: NEGATIVE
KETONES UA: NEGATIVE
LEUKOCYTES UA: NEGATIVE
Nitrite, UA: NEGATIVE

## 2015-10-18 NOTE — Progress Notes (Signed)
.   G2P1001 [redacted]w[redacted]d Estimated Date of Delivery: 03/28/16  Blood pressure 108/64, pulse 64, weight 174 lb (78.9 kg), last menstrual period 06/22/2015.   BP weight and urine results all reviewed and noted.  Please refer to the obstetrical flow sheet for the fundal height and fetal heart rate documentation:  Patient reports good fetal movement, denies any bleeding and no rupture of membranes symptoms or regular contractions. Patient is without complaints.  Did not fill rx for fioricet, HA's better now.  All questions were answered.  Orders Placed This Encounter  Procedures  . US OB Comp + 14 Wk  . Maternal Screen, Integrated #2  . POCT urinalysis dipstick    Plan:  Continued routine obstetrical care, 2nd IT   Return in about 3 weeks (around 11/08/2015) for LROB, [id number].

## 2015-10-18 NOTE — Patient Instructions (Signed)

## 2015-10-20 LAB — MATERNAL SCREEN, INTEGRATED #2
AFP MARKER: 23.1 ng/mL
AFP MOM: 0.68
CROWN RUMP LENGTH: 62.6 mm
DIA MoM: 1.31
DIA Value: 212.5 pg/mL
Estriol, Unconjugated: 1.07 ng/mL
GEST. AGE ON COLLECTION DATE: 12.6 wk
GESTATIONAL AGE: 16.6 wk
HCG VALUE: 28 [IU]/mL
MATERNAL AGE AT EDD: 29.5 a
NUMBER OF FETUSES: 1
Nuchal Translucency (NT): 2.1 mm
Nuchal Translucency MoM: 1.33
PAPP-A MOM: 0.49
PAPP-A VALUE: 418.6 ng/mL
Test Results:: NEGATIVE
WEIGHT: 168 [lb_av]
WEIGHT: 168 [lb_av]
hCG MoM: 0.95
uE3 MoM: 1.2

## 2015-11-08 ENCOUNTER — Encounter: Payer: Self-pay | Admitting: Advanced Practice Midwife

## 2015-11-08 ENCOUNTER — Ambulatory Visit (INDEPENDENT_AMBULATORY_CARE_PROVIDER_SITE_OTHER): Payer: BLUE CROSS/BLUE SHIELD

## 2015-11-08 ENCOUNTER — Ambulatory Visit (INDEPENDENT_AMBULATORY_CARE_PROVIDER_SITE_OTHER): Payer: BLUE CROSS/BLUE SHIELD | Admitting: Advanced Practice Midwife

## 2015-11-08 VITALS — BP 100/70 | HR 68 | Wt 178.0 lb

## 2015-11-08 DIAGNOSIS — Z36 Encounter for antenatal screening of mother: Secondary | ICD-10-CM | POA: Diagnosis not present

## 2015-11-08 DIAGNOSIS — Z3A20 20 weeks gestation of pregnancy: Secondary | ICD-10-CM | POA: Diagnosis not present

## 2015-11-08 DIAGNOSIS — Z331 Pregnant state, incidental: Secondary | ICD-10-CM

## 2015-11-08 DIAGNOSIS — Z3492 Encounter for supervision of normal pregnancy, unspecified, second trimester: Secondary | ICD-10-CM

## 2015-11-08 DIAGNOSIS — O321XX1 Maternal care for breech presentation, fetus 1: Secondary | ICD-10-CM

## 2015-11-08 DIAGNOSIS — Z1389 Encounter for screening for other disorder: Secondary | ICD-10-CM

## 2015-11-08 DIAGNOSIS — Z363 Encounter for antenatal screening for malformations: Secondary | ICD-10-CM

## 2015-11-08 LAB — POCT URINALYSIS DIPSTICK
Blood, UA: NEGATIVE
Glucose, UA: NEGATIVE
Ketones, UA: NEGATIVE
NITRITE UA: NEGATIVE
PROTEIN UA: NEGATIVE

## 2015-11-08 NOTE — Patient Instructions (Signed)
Sciatica With Rehab The sciatic nerve runs from the back down the leg and is responsible for sensation and control of the muscles in the back (posterior) side of the thigh, lower leg, and foot. Sciatica is a condition that is characterized by inflammation of this nerve.  SYMPTOMS   Signs of nerve damage, including numbness and/or weakness along the posterior side of the lower extremity.  Pain in the back of the thigh that may also travel down the leg.  Pain that worsens when sitting for long periods of time.  Occasionally, pain in the back or buttock. CAUSES  Inflammation of the sciatic nerve is the cause of sciatica. The inflammation is due to something irritating the nerve. Common sources of irritation include:  Sitting for long periods of time.  Direct trauma to the nerve.  Arthritis of the spine.  Herniated or ruptured disk.  Slipping of the vertebrae (spondylolisthesis).  Pressure from soft tissues, such as muscles or ligament-like tissue (fascia). RISK INCREASES WITH:  Sports that place pressure or stress on the spine (football or weightlifting).  Poor strength and flexibility.  Failure to warm up properly before activity.  Family history of low back pain or disk disorders.  Previous back injury or surgery.  Poor body mechanics, especially when lifting, or poor posture. PREVENTION   Warm up and stretch properly before activity.  Maintain physical fitness:  Strength, flexibility, and endurance.  Cardiovascular fitness.  Learn and use proper technique, especially with posture and lifting. When possible, have coach correct improper technique.  Avoid activities that place stress on the spine. PROGNOSIS If treated properly, then sciatica usually resolves within 6 weeks. However, occasionally surgery is necessary.  RELATED COMPLICATIONS   Permanent nerve damage, including pain, numbness, tingle, or weakness.  Chronic back pain.  Risks of surgery: infection,  bleeding, nerve damage, or damage to surrounding tissues. TREATMENT Treatment initially involves resting from any activities that aggravate your symptoms. The use of ice and medication may help reduce pain and inflammation. The use of strengthening and stretching exercises may help reduce pain with activity. These exercises may be performed at home or with referral to a therapist. A therapist may recommend further treatments, such as transcutaneous electronic nerve stimulation (TENS) or ultrasound. Your caregiver may recommend corticosteroid injections to help reduce inflammation of the sciatic nerve. If symptoms persist despite non-surgical (conservative) treatment, then surgery may be recommended. MEDICATION  If pain medication is necessary, then nonsteroidal anti-inflammatory medications, such as aspirin and ibuprofen, or other minor pain relievers, such as acetaminophen, are often recommended.  Do not take pain medication for 7 days before surgery.  Prescription pain relievers may be given if deemed necessary by your caregiver. Use only as directed and only as much as you need.  Ointments applied to the skin may be helpful.  Corticosteroid injections may be given by your caregiver. These injections should be reserved for the most serious cases, because they may only be given a certain number of times. HEAT AND COLD  Cold treatment (icing) relieves pain and reduces inflammation. Cold treatment should be applied for 10 to 15 minutes every 2 to 3 hours for inflammation and pain and immediately after any activity that aggravates your symptoms. Use ice packs or massage the area with a piece of ice (ice massage).  Heat treatment may be used prior to performing the stretching and strengthening activities prescribed by your caregiver, physical therapist, or athletic trainer. Use a heat pack or soak the injury in warm water.   SEEK MEDICAL CARE IF:  Treatment seems to offer no benefit, or the condition  worsens.  Any medications produce adverse side effects. EXERCISES  RANGE OF MOTION (ROM) AND STRETCHING EXERCISES - Sciatica Most people with sciatic will find that their symptoms worsen with either excessive bending forward (flexion) or arching at the low back (extension). The exercises which will help resolve your symptoms will focus on the opposite motion. Your physician, physical therapist or athletic trainer will help you determine which exercises will be most helpful to resolve your low back pain. Do not complete any exercises without first consulting with your clinician. Discontinue any exercises which worsen your symptoms until you speak to your clinician. If you have pain, numbness or tingling which travels down into your buttocks, leg or foot, the goal of the therapy is for these symptoms to move closer to your back and eventually resolve. Occasionally, these leg symptoms will get better, but your low back pain may worsen; this is typically an indication of progress in your rehabilitation. Be certain to be very alert to any changes in your symptoms and the activities in which you participated in the 24 hours prior to the change. Sharing this information with your clinician will allow him/her to most efficiently treat your condition. These exercises may help you when beginning to rehabilitate your injury. Your symptoms may resolve with or without further involvement from your physician, physical therapist or athletic trainer. While completing these exercises, remember:   Restoring tissue flexibility helps normal motion to return to the joints. This allows healthier, less painful movement and activity.  An effective stretch should be held for at least 30 seconds.  A stretch should never be painful. You should only feel a gentle lengthening or release in the stretched tissue. FLEXION RANGE OF MOTION AND STRETCHING EXERCISES: STRETCH - Flexion, Single Knee to Chest   Lie on a firm bed or floor  with both legs extended in front of you.  Keeping one leg in contact with the floor, bring your opposite knee to your chest. Hold your leg in place by either grabbing behind your thigh or at your knee.  Pull until you feel a gentle stretch in your low back. Hold __________ seconds.  Slowly release your grasp and repeat the exercise with the opposite side. Repeat __________ times. Complete this exercise __________ times per day.  STRETCH - Flexion, Double Knee to Chest  Lie on a firm bed or floor with both legs extended in front of you.  Keeping one leg in contact with the floor, bring your opposite knee to your chest.  Tense your stomach muscles to support your back and then lift your other knee to your chest. Hold your legs in place by either grabbing behind your thighs or at your knees.  Pull both knees toward your chest until you feel a gentle stretch in your low back. Hold __________ seconds.  Tense your stomach muscles and slowly return one leg at a time to the floor. Repeat __________ times. Complete this exercise __________ times per day.  STRETCH - Low Trunk Rotation   Lie on a firm bed or floor. Keeping your legs in front of you, bend your knees so they are both pointed toward the ceiling and your feet are flat on the floor.  Extend your arms out to the side. This will stabilize your upper body by keeping your shoulders in contact with the floor.  Gently and slowly drop both knees together to one side until   you feel a gentle stretch in your low back. Hold for __________ seconds.  Tense your stomach muscles to support your low back as you bring your knees back to the starting position. Repeat the exercise to the other side. Repeat __________ times. Complete this exercise __________ times per day  EXTENSION RANGE OF MOTION AND FLEXIBILITY EXERCISES: STRETCH - Extension, Prone on Elbows  Lie on your stomach on the floor, a bed will be too soft. Place your palms about shoulder  width apart and at the height of your head.  Place your elbows under your shoulders. If this is too painful, stack pillows under your chest.  Allow your body to relax so that your hips drop lower and make contact more completely with the floor.  Hold this position for __________ seconds.  Slowly return to lying flat on the floor. Repeat __________ times. Complete this exercise __________ times per day.  RANGE OF MOTION - Extension, Prone Press Ups  Lie on your stomach on the floor, a bed will be too soft. Place your palms about shoulder width apart and at the height of your head.  Keeping your back as relaxed as possible, slowly straighten your elbows while keeping your hips on the floor. You may adjust the placement of your hands to maximize your comfort. As you gain motion, your hands will come more underneath your shoulders.  Hold this position __________ seconds.  Slowly return to lying flat on the floor. Repeat __________ times. Complete this exercise __________ times per day.  STRENGTHENING EXERCISES - Sciatica  These exercises may help you when beginning to rehabilitate your injury. These exercises should be done near your "sweet spot." This is the neutral, low-back arch, somewhere between fully rounded and fully arched, that is your least painful position. When performed in this safe range of motion, these exercises can be used for people who have either a flexion or extension based injury. These exercises may resolve your symptoms with or without further involvement from your physician, physical therapist or athletic trainer. While completing these exercises, remember:   Muscles can gain both the endurance and the strength needed for everyday activities through controlled exercises.  Complete these exercises as instructed by your physician, physical therapist or athletic trainer. Progress with the resistance and repetition exercises only as your caregiver advises.  You may  experience muscle soreness or fatigue, but the pain or discomfort you are trying to eliminate should never worsen during these exercises. If this pain does worsen, stop and make certain you are following the directions exactly. If the pain is still present after adjustments, discontinue the exercise until you can discuss the trouble with your clinician. STRENGTHENING - Deep Abdominals, Pelvic Tilt   Lie on a firm bed or floor. Keeping your legs in front of you, bend your knees so they are both pointed toward the ceiling and your feet are flat on the floor.  Tense your lower abdominal muscles to press your low back into the floor. This motion will rotate your pelvis so that your tail bone is scooping upwards rather than pointing at your feet or into the floor.  With a gentle tension and even breathing, hold this position for __________ seconds. Repeat __________ times. Complete this exercise __________ times per day.  STRENGTHENING - Abdominals, Crunches   Lie on a firm bed or floor. Keeping your legs in front of you, bend your knees so they are both pointed toward the ceiling and your feet are flat on the   floor. Cross your arms over your chest.  Slightly tip your chin down without bending your neck.  Tense your abdominals and slowly lift your trunk high enough to just clear your shoulder blades. Lifting higher can put excessive stress on the low back and does not further strengthen your abdominal muscles.  Control your return to the starting position. Repeat __________ times. Complete this exercise __________ times per day.  STRENGTHENING - Quadruped, Opposite UE/LE Lift  Assume a hands and knees position on a firm surface. Keep your hands under your shoulders and your knees under your hips. You may place padding under your knees for comfort.  Find your neutral spine and gently tense your abdominal muscles so that you can maintain this position. Your shoulders and hips should form a rectangle  that is parallel with the floor and is not twisted.  Keeping your trunk steady, lift your right hand no higher than your shoulder and then your left leg no higher than your hip. Make sure you are not holding your breath. Hold this position __________ seconds.  Continuing to keep your abdominal muscles tense and your back steady, slowly return to your starting position. Repeat with the opposite arm and leg. Repeat __________ times. Complete this exercise __________ times per day.  STRENGTHENING - Abdominals and Quadriceps, Straight Leg Raise   Lie on a firm bed or floor with both legs extended in front of you.  Keeping one leg in contact with the floor, bend the other knee so that your foot can rest flat on the floor.  Find your neutral spine, and tense your abdominal muscles to maintain your spinal position throughout the exercise.  Slowly lift your straight leg off the floor about 6 inches for a count of 15, making sure to not hold your breath.  Still keeping your neutral spine, slowly lower your leg all the way to the floor. Repeat this exercise with each leg __________ times. Complete this exercise __________ times per day. POSTURE AND BODY MECHANICS CONSIDERATIONS - Sciatica Keeping correct posture when sitting, standing or completing your activities will reduce the stress put on different body tissues, allowing injured tissues a chance to heal and limiting painful experiences. The following are general guidelines for improved posture. Your physician or physical therapist will provide you with any instructions specific to your needs. While reading these guidelines, remember:  The exercises prescribed by your provider will help you have the flexibility and strength to maintain correct postures.  The correct posture provides the optimal environment for your joints to work. All of your joints have less wear and tear when properly supported by a spine with good posture. This means you will  experience a healthier, less painful body.  Correct posture must be practiced with all of your activities, especially prolonged sitting and standing. Correct posture is as important when doing repetitive low-stress activities (typing) as it is when doing a single heavy-load activity (lifting). RESTING POSITIONS Consider which positions are most painful for you when choosing a resting position. If you have pain with flexion-based activities (sitting, bending, stooping, squatting), choose a position that allows you to rest in a less flexed posture. You would want to avoid curling into a fetal position on your side. If your pain worsens with extension-based activities (prolonged standing, working overhead), avoid resting in an extended position such as sleeping on your stomach. Most people will find more comfort when they rest with their spine in a more neutral position, neither too rounded nor too   arched. Lying on a non-sagging bed on your side with a pillow between your knees, or on your back with a pillow under your knees will often provide some relief. Keep in mind, being in any one position for a prolonged period of time, no matter how correct your posture, can still lead to stiffness. PROPER SITTING POSTURE In order to minimize stress and discomfort on your spine, you must sit with correct posture Sitting with good posture should be effortless for a healthy body. Returning to good posture is a gradual process. Many people can work toward this most comfortably by using various supports until they have the flexibility and strength to maintain this posture on their own. When sitting with proper posture, your ears will fall over your shoulders and your shoulders will fall over your hips. You should use the back of the chair to support your upper back. Your low back will be in a neutral position, just slightly arched. You may place a small pillow or folded towel at the base of your low back for support.  When  working at a desk, create an environment that supports good, upright posture. Without extra support, muscles fatigue and lead to excessive strain on joints and other tissues. Keep these recommendations in mind: CHAIR:   A chair should be able to slide under your desk when your back makes contact with the back of the chair. This allows you to work closely.  The chair's height should allow your eyes to be level with the upper part of your monitor and your hands to be slightly lower than your elbows. BODY POSITION  Your feet should make contact with the floor. If this is not possible, use a foot rest.  Keep your ears over your shoulders. This will reduce stress on your neck and low back. INCORRECT SITTING POSTURES   If you are feeling tired and unable to assume a healthy sitting posture, do not slouch or slump. This puts excessive strain on your back tissues, causing more damage and pain. Healthier options include:  Using more support, like a lumbar pillow.  Switching tasks to something that requires you to be upright or walking.  Talking a brief walk.  Lying down to rest in a neutral-spine position. PROLONGED STANDING WHILE SLIGHTLY LEANING FORWARD  When completing a task that requires you to lean forward while standing in one place for a long time, place either foot up on a stationary 2-4 inch high object to help maintain the best posture. When both feet are on the ground, the low back tends to lose its slight inward curve. If this curve flattens (or becomes too large), then the back and your other joints will experience too much stress, fatigue more quickly and can cause pain.  CORRECT STANDING POSTURES Proper standing posture should be assumed with all daily activities, even if they only take a few moments, like when brushing your teeth. As in sitting, your ears should fall over your shoulders and your shoulders should fall over your hips. You should keep a slight tension in your abdominal  muscles to brace your spine. Your tailbone should point down to the ground, not behind your body, resulting in an over-extended swayback posture.  INCORRECT STANDING POSTURES  Common incorrect standing postures include a forward head, locked knees and/or an excessive swayback. WALKING Walk with an upright posture. Your ears, shoulders and hips should all line-up. PROLONGED ACTIVITY IN A FLEXED POSITION When completing a task that requires you to bend forward   at your waist or lean over a low surface, try to find a way to stabilize 3 of 4 of your limbs. You can place a hand or elbow on your thigh or rest a knee on the surface you are reaching across. This will provide you more stability so that your muscles do not fatigue as quickly. By keeping your knees relaxed, or slightly bent, you will also reduce stress across your low back. CORRECT LIFTING TECHNIQUES DO :   Assume a wide stance. This will provide you more stability and the opportunity to get as close as possible to the object which you are lifting.  Tense your abdominals to brace your spine; then bend at the knees and hips. Keeping your back locked in a neutral-spine position, lift using your leg muscles. Lift with your legs, keeping your back straight.  Test the weight of unknown objects before attempting to lift them.  Try to keep your elbows locked down at your sides in order get the best strength from your shoulders when carrying an object.  Always ask for help when lifting heavy or awkward objects. INCORRECT LIFTING TECHNIQUES DO NOT:   Lock your knees when lifting, even if it is a small object.  Bend and twist. Pivot at your feet or move your feet when needing to change directions.  Assume that you cannot safely pick up a paperclip without proper posture.   This information is not intended to replace advice given to you by your health care provider. Make sure you discuss any questions you have with your health care provider.     Document Released: 03/11/2005 Document Revised: 07/26/2014 Document Reviewed: 06/23/2008 [employer] Interactive Patient Education 2016 [employer]  

## 2015-11-08 NOTE — Progress Notes (Signed)
US 19+6 wks,breech,post pl gr 0,normal ov's bilat,cx 3.7 cm,svp of fluid 3.6 cm,fhr 141 bpm,efw 299 g,anatomy complete,no obvious abnormalities seen

## 2015-11-08 NOTE — Progress Notes (Signed)
[id number] [id number] Estimated Date of Delivery: 03/28/16  Blood pressure 100/70, pulse 68, weight 178 lb (80.7 kg), last menstrual period 06/22/2015.   BP weight and urine results all reviewed and noted.  Please refer to the obstetrical flow sheet for the fundal height and fetal heart rate documentation:  US 19+6 wks,breech,post pl gr 0,normal ov's bilat,cx 3.7 cm,svp of fluid 3.6 cm,fhr 141 bpm,efw 299 g,anatomy complete,no obvious abnormalities seen  Patient reports good fetal movement, denies any bleeding and no rupture of membranes symptoms or regular contractions. Patient has some sciatica All questions were answered.  Orders Placed This Encounter  Procedures  . POCT urinalysis dipstick    Plan:  Continued routine obstetrical care,   Return in about 4 weeks (around 12/06/2015) for LROB.

## 2015-12-06 ENCOUNTER — Ambulatory Visit (INDEPENDENT_AMBULATORY_CARE_PROVIDER_SITE_OTHER): Payer: BLUE CROSS/BLUE SHIELD | Admitting: Women's Health

## 2015-12-06 ENCOUNTER — Other Ambulatory Visit (HOSPITAL_COMMUNITY)
Admission: RE | Admit: 2015-12-06 | Discharge: 2015-12-06 | Disposition: A | Discharge status: Home / Self Care | Payer: BLUE CROSS/BLUE SHIELD | Source: Ambulatory Visit | Attending: Obstetrics & Gynecology | Admitting: Obstetrics & Gynecology

## 2015-12-06 ENCOUNTER — Encounter: Payer: Self-pay | Admitting: Women's Health

## 2015-12-06 VITALS — BP 110/54 | HR 84 | Wt 183.0 lb

## 2015-12-06 DIAGNOSIS — Z331 Pregnant state, incidental: Secondary | ICD-10-CM

## 2015-12-06 DIAGNOSIS — Z3492 Encounter for supervision of normal pregnancy, unspecified, second trimester: Secondary | ICD-10-CM

## 2015-12-06 DIAGNOSIS — Z01411 Encounter for gynecological examination (general) (routine) with abnormal findings: Secondary | ICD-10-CM | POA: Diagnosis present

## 2015-12-06 DIAGNOSIS — Z124 Encounter for screening for malignant neoplasm of cervix: Secondary | ICD-10-CM

## 2015-12-06 DIAGNOSIS — Z1389 Encounter for screening for other disorder: Secondary | ICD-10-CM

## 2015-12-06 LAB — POCT URINALYSIS DIPSTICK
Blood, UA: NEGATIVE
Glucose, UA: NEGATIVE
Ketones, UA: NEGATIVE
Nitrite, UA: NEGATIVE
PROTEIN UA: NEGATIVE

## 2015-12-06 NOTE — Patient Instructions (Addendum)
You will have your sugar test next visit.  Please do not eat or drink anything after midnight the night before you come, not even water.  You will be here for at least two hours.     For your lower back pain you may:   Purchase a pregnancy belt from [employer], [employer], [employer], etc and wear it while you are up and about  Take warm baths  Use a heating pad to your lower back for no longer than 20 minutes at a time, and do not place near abdomen  Take tylenol as needed. Please follow directions on the bottle    Call the office [phone]) or go to [facility] if:  You begin to have strong, frequent contractions  Your water breaks.  Sometimes it is a big gush of fluid, sometimes it is just a trickle that keeps getting your panties wet or running down your legs  You have vaginal bleeding.  It is normal to have a small amount of spotting if your cervix was checked.   You don't feel your baby moving like normal.  If you don't, get you something to eat and drink and lay down and focus on feeling your baby move.   If your baby is still not moving like normal, you should call the office or go to [facility].   Sciatica With Rehab The sciatic nerve runs from the back down the leg and is responsible for sensation and control of the muscles in the back (posterior) side of the thigh, lower leg, and foot. Sciatica is a condition that is characterized by inflammation of this nerve.  SYMPTOMS   Signs of nerve damage, including numbness and/or weakness along the posterior side of the lower extremity.  Pain in the back of the thigh that may also travel down the leg.  Pain that worsens when sitting for long periods of time.  Occasionally, pain in the back or buttock. CAUSES  Inflammation of the sciatic nerve is the cause of sciatica. The inflammation is due to something irritating the nerve. Common sources of irritation include:  Sitting for long periods of time.  Direct  trauma to the nerve.  Arthritis of the spine.  Herniated or ruptured disk.  Slipping of the vertebrae (spondylolisthesis).  Pressure from soft tissues, such as muscles or ligament-like tissue (fascia). RISK INCREASES WITH:  Sports that place pressure or stress on the spine (football or weightlifting).  Poor strength and flexibility.  Failure to warm up properly before activity.  Family history of low back pain or disk disorders.  Previous back injury or surgery.  Poor body mechanics, especially when lifting, or poor posture. PREVENTION   Warm up and stretch properly before activity.  Maintain physical fitness:  Strength, flexibility, and endurance.  Cardiovascular fitness.  Learn and use proper technique, especially with posture and lifting. When possible, have coach correct improper technique.  Avoid activities that place stress on the spine. PROGNOSIS If treated properly, then sciatica usually resolves within 6 weeks. However, occasionally surgery is necessary.  RELATED COMPLICATIONS   Permanent nerve damage, including pain, numbness, tingle, or weakness.  Chronic back pain.  Risks of surgery: infection, bleeding, nerve damage, or damage to surrounding tissues. TREATMENT Treatment initially involves resting from any activities that aggravate your symptoms. The use of ice and medication may help reduce pain and inflammation. The use of strengthening and stretching exercises may help reduce pain with activity. These exercises may be performed at home or  with referral to a therapist. A therapist may recommend further treatments, such as transcutaneous electronic nerve stimulation (TENS) or ultrasound. Your caregiver may recommend corticosteroid injections to help reduce inflammation of the sciatic nerve. If symptoms persist despite non-surgical (conservative) treatment, then surgery may be recommended. MEDICATION  If pain medication is necessary, then nonsteroidal  anti-inflammatory medications, such as aspirin and ibuprofen, or other minor pain relievers, such as acetaminophen, are often recommended.  Do not take pain medication for 7 days before surgery.  Prescription pain relievers may be given if deemed necessary by your caregiver. Use only as directed and only as much as you need.  Ointments applied to the skin may be helpful.  Corticosteroid injections may be given by your caregiver. These injections should be reserved for the most serious cases, because they may only be given a certain number of times. HEAT AND COLD  Cold treatment (icing) relieves pain and reduces inflammation. Cold treatment should be applied for 10 to 15 minutes every 2 to 3 hours for inflammation and pain and immediately after any activity that aggravates your symptoms. Use ice packs or massage the area with a piece of ice (ice massage).  Heat treatment may be used prior to performing the stretching and strengthening activities prescribed by your caregiver, physical therapist, or athletic trainer. Use a heat pack or soak the injury in warm water. SEEK MEDICAL CARE IF:  Treatment seems to offer no benefit, or the condition worsens.  Any medications produce adverse side effects. EXERCISES  RANGE OF MOTION (ROM) AND STRETCHING EXERCISES - Sciatica Most people with sciatic will find that their symptoms worsen with either excessive bending forward (flexion) or arching at the low back (extension). The exercises which will help resolve your symptoms will focus on the opposite motion. Your physician, physical therapist or athletic trainer will help you determine which exercises will be most helpful to resolve your low back pain. Do not complete any exercises without first consulting with your clinician. Discontinue any exercises which worsen your symptoms until you speak to your clinician. If you have pain, numbness or tingling which travels down into your buttocks, leg or foot, the  goal of the therapy is for these symptoms to move closer to your back and eventually resolve. Occasionally, these leg symptoms will get better, but your low back pain may worsen; this is typically an indication of progress in your rehabilitation. Be certain to be very alert to any changes in your symptoms and the activities in which you participated in the 24 hours prior to the change. Sharing this information with your clinician will allow him/her to most efficiently treat your condition. These exercises may help you when beginning to rehabilitate your injury. Your symptoms may resolve with or without further involvement from your physician, physical therapist or athletic trainer. While completing these exercises, remember:   Restoring tissue flexibility helps normal motion to return to the joints. This allows healthier, less painful movement and activity.  An effective stretch should be held for at least 30 seconds.  A stretch should never be painful. You should only feel a gentle lengthening or release in the stretched tissue. FLEXION RANGE OF MOTION AND STRETCHING EXERCISES: STRETCH - Flexion, Single Knee to Chest   Lie on a firm bed or floor with both legs extended in front of you.  Keeping one leg in contact with the floor, bring your opposite knee to your chest. Hold your leg in place by either grabbing behind your thigh or at  your knee.  Pull until you feel a gentle stretch in your low back. Hold __________ seconds.  Slowly release your grasp and repeat the exercise with the opposite side. Repeat __________ times. Complete this exercise __________ times per day.  STRETCH - Flexion, Double Knee to Chest  Lie on a firm bed or floor with both legs extended in front of you.  Keeping one leg in contact with the floor, bring your opposite knee to your chest.  Tense your stomach muscles to support your back and then lift your other knee to your chest. Hold your legs in place by either  grabbing behind your thighs or at your knees.  Pull both knees toward your chest until you feel a gentle stretch in your low back. Hold __________ seconds.  Tense your stomach muscles and slowly return one leg at a time to the floor. Repeat __________ times. Complete this exercise __________ times per day.  STRETCH - Low Trunk Rotation   Lie on a firm bed or floor. Keeping your legs in front of you, bend your knees so they are both pointed toward the ceiling and your feet are flat on the floor.  Extend your arms out to the side. This will stabilize your upper body by keeping your shoulders in contact with the floor.  Gently and slowly drop both knees together to one side until you feel a gentle stretch in your low back. Hold for __________ seconds.  Tense your stomach muscles to support your low back as you bring your knees back to the starting position. Repeat the exercise to the other side. Repeat __________ times. Complete this exercise __________ times per day  EXTENSION RANGE OF MOTION AND FLEXIBILITY EXERCISES: STRETCH - Extension, Prone on Elbows  Lie on your stomach on the floor, a bed will be too soft. Place your palms about shoulder width apart and at the height of your head.  Place your elbows under your shoulders. If this is too painful, stack pillows under your chest.  Allow your body to relax so that your hips drop lower and make contact more completely with the floor.  Hold this position for __________ seconds.  Slowly return to lying flat on the floor. Repeat __________ times. Complete this exercise __________ times per day.  RANGE OF MOTION - Extension, Prone Press Ups  Lie on your stomach on the floor, a bed will be too soft. Place your palms about shoulder width apart and at the height of your head.  Keeping your back as relaxed as possible, slowly straighten your elbows while keeping your hips on the floor. You may adjust the placement of your hands to maximize  your comfort. As you gain motion, your hands will come more underneath your shoulders.  Hold this position __________ seconds.  Slowly return to lying flat on the floor. Repeat __________ times. Complete this exercise __________ times per day.  STRENGTHENING EXERCISES - Sciatica  These exercises may help you when beginning to rehabilitate your injury. These exercises should be done near your "sweet spot." This is the neutral, low-back arch, somewhere between fully rounded and fully arched, that is your least painful position. When performed in this safe range of motion, these exercises can be used for people who have either a flexion or extension based injury. These exercises may resolve your symptoms with or without further involvement from your physician, physical therapist or athletic trainer. While completing these exercises, remember:   Muscles can gain both the endurance and the  strength needed for everyday activities through controlled exercises.  Complete these exercises as instructed by your physician, physical therapist or athletic trainer. Progress with the resistance and repetition exercises only as your caregiver advises.  You may experience muscle soreness or fatigue, but the pain or discomfort you are trying to eliminate should never worsen during these exercises. If this pain does worsen, stop and make certain you are following the directions exactly. If the pain is still present after adjustments, discontinue the exercise until you can discuss the trouble with your clinician. STRENGTHENING - Deep Abdominals, Pelvic Tilt   Lie on a firm bed or floor. Keeping your legs in front of you, bend your knees so they are both pointed toward the ceiling and your feet are flat on the floor.  Tense your lower abdominal muscles to press your low back into the floor. This motion will rotate your pelvis so that your tail bone is scooping upwards rather than pointing at your feet or into the  floor.  With a gentle tension and even breathing, hold this position for __________ seconds. Repeat __________ times. Complete this exercise __________ times per day.  STRENGTHENING - Abdominals, Crunches   Lie on a firm bed or floor. Keeping your legs in front of you, bend your knees so they are both pointed toward the ceiling and your feet are flat on the floor. Cross your arms over your chest.  Slightly tip your chin down without bending your neck.  Tense your abdominals and slowly lift your trunk high enough to just clear your shoulder blades. Lifting higher can put excessive stress on the low back and does not further strengthen your abdominal muscles.  Control your return to the starting position. Repeat __________ times. Complete this exercise __________ times per day.  STRENGTHENING - Quadruped, Opposite UE/LE Lift  Assume a hands and knees position on a firm surface. Keep your hands under your shoulders and your knees under your hips. You may place padding under your knees for comfort.  Find your neutral spine and gently tense your abdominal muscles so that you can maintain this position. Your shoulders and hips should form a rectangle that is parallel with the floor and is not twisted.  Keeping your trunk steady, lift your right hand no higher than your shoulder and then your left leg no higher than your hip. Make sure you are not holding your breath. Hold this position __________ seconds.  Continuing to keep your abdominal muscles tense and your back steady, slowly return to your starting position. Repeat with the opposite arm and leg. Repeat __________ times. Complete this exercise __________ times per day.  STRENGTHENING - Abdominals and Quadriceps, Straight Leg Raise   Lie on a firm bed or floor with both legs extended in front of you.  Keeping one leg in contact with the floor, bend the other knee so that your foot can rest flat on the floor.  Find your neutral spine, and  tense your abdominal muscles to maintain your spinal position throughout the exercise.  Slowly lift your straight leg off the floor about 6 inches for a count of 15, making sure to not hold your breath.  Still keeping your neutral spine, slowly lower your leg all the way to the floor. Repeat this exercise with each leg __________ times. Complete this exercise __________ times per day. POSTURE AND BODY MECHANICS CONSIDERATIONS - Sciatica Keeping correct posture when sitting, standing or completing your activities will reduce the stress put on different  body tissues, allowing injured tissues a chance to heal and limiting painful experiences. The following are general guidelines for improved posture. Your physician or physical therapist will provide you with any instructions specific to your needs. While reading these guidelines, remember:  The exercises prescribed by your provider will help you have the flexibility and strength to maintain correct postures.  The correct posture provides the optimal environment for your joints to work. All of your joints have less wear and tear when properly supported by a spine with good posture. This means you will experience a healthier, less painful body.  Correct posture must be practiced with all of your activities, especially prolonged sitting and standing. Correct posture is as important when doing repetitive low-stress activities (typing) as it is when doing a single heavy-load activity (lifting). RESTING POSITIONS Consider which positions are most painful for you when choosing a resting position. If you have pain with flexion-based activities (sitting, bending, stooping, squatting), choose a position that allows you to rest in a less flexed posture. You would want to avoid curling into a fetal position on your side. If your pain worsens with extension-based activities (prolonged standing, working overhead), avoid resting in an extended position such as sleeping  on your stomach. Most people will find more comfort when they rest with their spine in a more neutral position, neither too rounded nor too arched. Lying on a non-sagging bed on your side with a pillow between your knees, or on your back with a pillow under your knees will often provide some relief. Keep in mind, being in any one position for a prolonged period of time, no matter how correct your posture, can still lead to stiffness. PROPER SITTING POSTURE In order to minimize stress and discomfort on your spine, you must sit with correct posture Sitting with good posture should be effortless for a healthy body. Returning to good posture is a gradual process. Many people can work toward this most comfortably by using various supports until they have the flexibility and strength to maintain this posture on their own. When sitting with proper posture, your ears will fall over your shoulders and your shoulders will fall over your hips. You should use the back of the chair to support your upper back. Your low back will be in a neutral position, just slightly arched. You may place a small pillow or folded towel at the base of your low back for support.  When working at a desk, create an environment that supports good, upright posture. Without extra support, muscles fatigue and lead to excessive strain on joints and other tissues. Keep these recommendations in mind: CHAIR:   A chair should be able to slide under your desk when your back makes contact with the back of the chair. This allows you to work closely.  The chair's height should allow your eyes to be level with the upper part of your monitor and your hands to be slightly lower than your elbows. BODY POSITION  Your feet should make contact with the floor. If this is not possible, use a foot rest.  Keep your ears over your shoulders. This will reduce stress on your neck and low back. INCORRECT SITTING POSTURES   If you are feeling tired and unable  to assume a healthy sitting posture, do not slouch or slump. This puts excessive strain on your back tissues, causing more damage and pain. Healthier options include:  Using more support, like a lumbar pillow.  Switching tasks to something  that requires you to be upright or walking.  Talking a brief walk.  Lying down to rest in a neutral-spine position. PROLONGED STANDING WHILE SLIGHTLY LEANING FORWARD  When completing a task that requires you to lean forward while standing in one place for a long time, place either foot up on a stationary 2-4 inch high object to help maintain the best posture. When both feet are on the ground, the low back tends to lose its slight inward curve. If this curve flattens (or becomes too large), then the back and your other joints will experience too much stress, fatigue more quickly and can cause pain.  CORRECT STANDING POSTURES Proper standing posture should be assumed with all daily activities, even if they only take a few moments, like when brushing your teeth. As in sitting, your ears should fall over your shoulders and your shoulders should fall over your hips. You should keep a slight tension in your abdominal muscles to brace your spine. Your tailbone should point down to the ground, not behind your body, resulting in an over-extended swayback posture.  INCORRECT STANDING POSTURES  Common incorrect standing postures include a forward head, locked knees and/or an excessive swayback. WALKING Walk with an upright posture. Your ears, shoulders and hips should all line-up. PROLONGED ACTIVITY IN A FLEXED POSITION When completing a task that requires you to bend forward at your waist or lean over a low surface, try to find a way to stabilize 3 of 4 of your limbs. You can place a hand or elbow on your thigh or rest a knee on the surface you are reaching across. This will provide you more stability so that your muscles do not fatigue as quickly. By keeping your knees  relaxed, or slightly bent, you will also reduce stress across your low back. CORRECT LIFTING TECHNIQUES DO :   Assume a wide stance. This will provide you more stability and the opportunity to get as close as possible to the object which you are lifting.  Tense your abdominals to brace your spine; then bend at the knees and hips. Keeping your back locked in a neutral-spine position, lift using your leg muscles. Lift with your legs, keeping your back straight.  Test the weight of unknown objects before attempting to lift them.  Try to keep your elbows locked down at your sides in order get the best strength from your shoulders when carrying an object.  Always ask for help when lifting heavy or awkward objects. INCORRECT LIFTING TECHNIQUES DO NOT:   Lock your knees when lifting, even if it is a small object.  Bend and twist. Pivot at your feet or move your feet when needing to change directions.  Assume that you cannot safely pick up a paperclip without proper posture.   This information is not intended to replace advice given to you by your health care provider. Make sure you discuss any questions you have with your health care provider.   Document Released: 03/11/2005 Document Revised: 07/26/2014 Document Reviewed: 06/23/2008 Elsevier Interactive Patient Education 2016 [employer].   Second Trimester of Pregnancy The second trimester is from week 13 through week 28, months 4 through 6. The second trimester is often a time when you feel your best. Your body has also adjusted to being pregnant, and you begin to feel better physically. Usually, morning sickness has lessened or quit completely, you may have more energy, and you may have an increase in appetite. The second trimester is also a time  when the fetus is growing rapidly. At the end of the sixth month, the fetus is about 9 inches long and weighs about 1 pounds. You will likely begin to feel the baby move (quickening) between 18  and 20 weeks of the pregnancy. BODY CHANGES Your body goes through many changes during pregnancy. The changes vary from woman to woman.   Your weight will continue to increase. You will notice your lower abdomen bulging out.  You may begin to get stretch marks on your hips, abdomen, and breasts.  You may develop headaches that can be relieved by medicines approved by your health care provider.  You may urinate more often because the fetus is pressing on your bladder.  You may develop or continue to have heartburn as a result of your pregnancy.  You may develop constipation because certain hormones are causing the muscles that push waste through your intestines to slow down.  You may develop hemorrhoids or swollen, bulging veins (varicose veins).  You may have back pain because of the weight gain and pregnancy hormones relaxing your joints between the bones in your pelvis and as a result of a shift in weight and the muscles that support your balance.  Your breasts will continue to grow and be tender.  Your gums may bleed and may be sensitive to brushing and flossing.  Dark spots or blotches (chloasma, mask of pregnancy) may develop on your face. This will likely fade after the baby is born.  A dark line from your belly button to the pubic area (linea nigra) may appear. This will likely fade after the baby is born.  You may have changes in your hair. These can include thickening of your hair, rapid growth, and changes in texture. Some women also have hair loss during or after pregnancy, or hair that feels dry or thin. Your hair will most likely return to normal after your baby is born. WHAT TO EXPECT AT YOUR PRENATAL VISITS During a routine prenatal visit:  You will be weighed to make sure you and the fetus are growing normally.  Your blood pressure will be taken.  Your abdomen will be measured to track your baby's growth.  The fetal heartbeat will be listened to.  Any test  results from the previous visit will be discussed. Your health care provider may ask you:  How you are feeling.  If you are feeling the baby move.  If you have had any abnormal symptoms, such as leaking fluid, bleeding, severe headaches, or abdominal cramping.  If you have any questions. Other tests that may be performed during your second trimester include:  Blood tests that check for:  Low iron levels (anemia).  Gestational diabetes (between 24 and 28 weeks).  Rh antibodies.  Urine tests to check for infections, diabetes, or protein in the urine.  An ultrasound to confirm the proper growth and development of the baby.  An amniocentesis to check for possible genetic problems.  Fetal screens for spina bifida and Down syndrome. HOME CARE INSTRUCTIONS   Avoid all smoking, herbs, alcohol, and unprescribed drugs. These chemicals affect the formation and growth of the baby.  Follow your health care provider's instructions regarding medicine use. There are medicines that are either safe or unsafe to take during pregnancy.  Exercise only as directed by your health care provider. Experiencing uterine cramps is a good sign to stop exercising.  Continue to eat regular, healthy meals.  Wear a good support bra for breast tenderness.  Do  not use hot tubs, steam rooms, or saunas.  Wear your seat belt at all times when driving.  Avoid raw meat, uncooked cheese, cat litter boxes, and soil used by cats. These carry germs that can cause birth defects in the baby.  Take your prenatal vitamins.  Try taking a stool softener (if your health care provider approves) if you develop constipation. Eat more high-fiber foods, such as fresh vegetables or fruit and whole grains. Drink plenty of fluids to keep your urine clear or pale yellow.  Take warm sitz baths to soothe any pain or discomfort caused by hemorrhoids. Use hemorrhoid cream if your health care provider approves.  If you develop  varicose veins, wear support hose. Elevate your feet for 15 minutes, 3-4 times a day. Limit salt in your diet.  Avoid heavy lifting, wear low heel shoes, and practice good posture.  Rest with your legs elevated if you have leg cramps or low back pain.  Visit your dentist if you have not gone yet during your pregnancy. Use a soft toothbrush to brush your teeth and be gentle when you floss.  A sexual relationship may be continued unless your health care provider directs you otherwise.  Continue to go to all your prenatal visits as directed by your health care provider. SEEK MEDICAL CARE IF:   You have dizziness.  You have mild pelvic cramps, pelvic pressure, or nagging pain in the abdominal area.  You have persistent nausea, vomiting, or diarrhea.  You have a bad smelling vaginal discharge.  You have pain with urination. SEEK IMMEDIATE MEDICAL CARE IF:   You have a fever.  You are leaking fluid from your vagina.  You have spotting or bleeding from your vagina.  You have severe abdominal cramping or pain.  You have rapid weight gain or loss.  You have shortness of breath with chest pain.  You notice sudden or extreme swelling of your face, hands, ankles, feet, or legs.  You have not felt your baby move in over an hour.  You have severe headaches that do not go away with medicine.  You have vision changes. Document Released: 03/05/2001 Document Revised: 03/16/2013 Document Reviewed: 05/12/2012 [facility] Patient Information 2015 [place], [place]. This information is not intended to replace advice given to you by your health care provider. Make sure you discuss any questions you have with your health care provider.   Heartburn During Pregnancy (Nexium, Zantac, Prilosec) Heartburn is a burning sensation in the chest caused by stomach acid backing up into the esophagus. Heartburn is common in pregnancy because a certain hormone (progesterone) is released when a woman is  pregnant. The progesterone hormone may relax the valve that separates the esophagus from the stomach. This allows acid to go up into the esophagus, causing heartburn. Heartburn may also happen in pregnancy because the enlarging uterus pushes up on the stomach, which pushes more acid into the esophagus. This is especially true in the later stages of pregnancy. Heartburn problems usually go away after giving birth. CAUSES  Heartburn is caused by stomach acid backing up into the esophagus. During pregnancy, this may result from various things, including:   The progesterone hormone.  Changing hormone levels.  The growing uterus pushing stomach acid upward.  Large meals.  Certain foods and drinks.  Exercise.  Increased acid production. SIGNS AND SYMPTOMS   Burning pain in the chest or lower throat.  Bitter taste in the mouth.  Coughing. DIAGNOSIS  Your health care provider will typically diagnose  heartburn by taking a careful history of your concern. Blood tests may be done to check for a certain type of bacteria that is associated with heartburn. Sometimes, heartburn is diagnosed by prescribing a heartburn medicine to see if the symptoms improve. In some cases, a procedure called an endoscopy may be done. In this procedure, a tube with a light and a camera on the end (endoscope) is used to examine the esophagus and the stomach. TREATMENT  Treatment will vary depending on the severity of your symptoms. Your health care provider may recommend:  Over-the-counter medicines (antacids, acid reducers) for mild heartburn.  Prescription medicines to decrease stomach acid or to protect your stomach lining.  Certain changes in your diet.  Elevating the head of your bed by putting blocks under the legs. This helps prevent stomach acid from backing up into the esophagus when you are lying down. HOME CARE INSTRUCTIONS   Only take over-the-counter or prescription medicines as directed by your  health care provider.  Raise the head of your bed by putting blocks under the legs if instructed to do so by your health care provider. Sleeping with more pillows is not effective because it only changes the position of your head.  Do not exercise right after eating.  Avoid eating 2-3 hours before bed. Do not lie down right after eating.  Eat small meals throughout the day instead of three large meals.  Identify foods and beverages that make your symptoms worse and avoid them. Foods you may want to avoid include:  Peppers.  Chocolate.  High-fat foods, including fried foods.  Spicy foods.  Garlic and onions.  Citrus fruits, including oranges, grapefruit, lemons, and limes.  Food containing tomatoes or tomato products.  Mint.  Carbonated and caffeinated drinks.  Vinegar. SEEK MEDICAL CARE IF:  You have abdominal pain of any kind.  You feel burning in your upper abdomen or chest, especially after eating or lying down.  You have nausea and vomiting.  Your stomach feels upset after you eat. SEEK IMMEDIATE MEDICAL CARE IF:   You have severe chest pain that goes down your arm or into your jaw or neck.  You feel sweaty, dizzy, or light-headed.  You become short of breath.  You vomit blood.  You have difficulty or pain with swallowing.  You have bloody or black, tarry stools.  You have episodes of heartburn more than 3 times a week, for more than 2 weeks. MAKE SURE YOU:  Understand these instructions.  Will watch your condition.  Will get help right away if you are not doing well or get worse.   This information is not intended to replace advice given to you by your health care provider. Make sure you discuss any questions you have with your health care provider.   Document Released: 03/08/2000 Document Revised: 04/01/2014 Document Reviewed: 10/28/2012 [employer] Interactive Patient Education [employer]

## 2015-12-06 NOTE — Progress Notes (Signed)
Low-risk OB appointment G2P1001 [id number] Estimated Date of Delivery: 03/28/16 BP (!) 110/54   Pulse 84   Wt 183 lb (83 kg)   LMP 06/22/2015   BMI 33.47 kg/m   BP, weight, and urine reviewed.  Refer to obstetrical flow sheet for FH & FHR.  Reports good fm.  Denies regular uc's, lof, vb, or uti s/s. No complaints. Reports last pap as 8290yrs ago- so thin prep pap obtained today. Reflux- doesn't like taste of TUMS- gave printed info. Sciatica pain- gave printed exercises. Reviewed ptl s/s, fm. Plan:  Continue routine obstetrical care  F/U in 4wks for OB appointment and pn2

## 2015-12-06 NOTE — Addendum Note (Signed)
Addended by: Gaylyn RongEVANS, Jayanth Szczesniak A on: 12/06/2015 03:34 PM   Modules accepted: Orders

## 2015-12-08 LAB — CYTOLOGY - PAP

## 2015-12-13 ENCOUNTER — Ambulatory Visit (INDEPENDENT_AMBULATORY_CARE_PROVIDER_SITE_OTHER): Payer: BLUE CROSS/BLUE SHIELD | Admitting: Advanced Practice Midwife

## 2015-12-13 VITALS — BP 110/60 | HR 70 | Temp 98.3°F | Wt 188.0 lb

## 2015-12-13 DIAGNOSIS — Z1389 Encounter for screening for other disorder: Secondary | ICD-10-CM

## 2015-12-13 DIAGNOSIS — Z331 Pregnant state, incidental: Secondary | ICD-10-CM

## 2015-12-13 DIAGNOSIS — R300 Dysuria: Secondary | ICD-10-CM | POA: Diagnosis not present

## 2015-12-13 DIAGNOSIS — J069 Acute upper respiratory infection, unspecified: Secondary | ICD-10-CM | POA: Diagnosis not present

## 2015-12-13 MED ORDER — LIDOCAINE VISCOUS 2 % MT SOLN: 5.0000 mL | OROMUCOSAL | 0 refills | Status: DC | PRN

## 2015-12-13 NOTE — Patient Instructions (Signed)

## 2015-12-13 NOTE — Progress Notes (Signed)
WORK IN FOR c/o stuffy nose, sore throat, HA, cold chills but no fever, mid back pain-hx of UTI/kidney stones   G2P1001 40w6dEstimated Date of Delivery: 03/28/16  Blood pressure 110/60, pulse 70, temperature 98.3 F (36.8 C), weight 188 lb (85.3 kg), last menstrual period 06/22/2015.   BP weight and urine results all reviewed and noted. Urine 2+ leuks only. Throat erythemous without exudate. Ears clear. Sniffing a lot. No cough or fever.   Please refer to the obstetrical flow sheet for the fundal height and fetal heart rate documentation:  Patient reports good fetal movement, denies any bleeding and no rupture of membranes symptoms or regular contractions.  All questions were answered.  Orders Placed This Encounter  Procedures  . Urine culture  . POCT urinalysis dipstick    Plan:  Continued routine obstetrical care, viral vs bacterial sx discussed.  Can call for ABX if guidelines are met. Pyelo precautions.  Culture urine.  Rx viscous lidocaine for sore throat  Return for As scheduled.

## 2015-12-15 LAB — URINE CULTURE: Organism ID, Bacteria: NO GROWTH

## 2015-12-18 ENCOUNTER — Ambulatory Visit (INDEPENDENT_AMBULATORY_CARE_PROVIDER_SITE_OTHER): Payer: BLUE CROSS/BLUE SHIELD | Admitting: Women's Health

## 2015-12-18 ENCOUNTER — Encounter: Payer: Self-pay | Admitting: Women's Health

## 2015-12-18 VITALS — BP 112/70 | HR 80 | Wt 185.0 lb

## 2015-12-18 DIAGNOSIS — Z3492 Encounter for supervision of normal pregnancy, unspecified, second trimester: Secondary | ICD-10-CM

## 2015-12-18 DIAGNOSIS — R109 Unspecified abdominal pain: Secondary | ICD-10-CM | POA: Diagnosis not present

## 2015-12-18 DIAGNOSIS — Z1389 Encounter for screening for other disorder: Secondary | ICD-10-CM | POA: Diagnosis not present

## 2015-12-18 DIAGNOSIS — N898 Other specified noninflammatory disorders of vagina: Secondary | ICD-10-CM | POA: Diagnosis not present

## 2015-12-18 DIAGNOSIS — Z331 Pregnant state, incidental: Secondary | ICD-10-CM | POA: Diagnosis not present

## 2015-12-18 DIAGNOSIS — O26899 Other specified pregnancy related conditions, unspecified trimester: Secondary | ICD-10-CM

## 2015-12-18 LAB — POCT WET PREP (WET MOUNT)
CLUE CELLS WET PREP WHIFF POC: NEGATIVE
TRICHOMONAS WET PREP HPF POC: ABSENT

## 2015-12-18 LAB — POCT URINALYSIS DIPSTICK
GLUCOSE UA: NEGATIVE
Ketones, UA: NEGATIVE
NITRITE UA: NEGATIVE
Protein, UA: NEGATIVE
RBC UA: NEGATIVE

## 2015-12-18 NOTE — Patient Instructions (Signed)
Increase water until urine is light yellow to clear  Preterm Labor Information Preterm labor is when labor starts at less than 37 weeks of pregnancy. The normal length of a pregnancy is 39 to 41 weeks. CAUSES Often, there is no identifiable underlying cause as to why a woman goes into preterm labor. One of the most common known causes of preterm labor is infection. Infections of the uterus, cervix, vagina, amniotic sac, bladder, kidney, or even the lungs (pneumonia) can cause labor to start. Other suspected causes of preterm labor include:   Urogenital infections, such as yeast infections and bacterial vaginosis.   Uterine abnormalities (uterine shape, uterine septum, fibroids, or bleeding from the placenta).   A cervix that has been operated on (it may fail to stay closed).   Malformations in the fetus.   Multiple gestations (twins, triplets, and so on).   Breakage of the amniotic sac.  RISK FACTORS  Having a previous history of preterm labor.   Having premature rupture of membranes (PROM).   Having a placenta that covers the opening of the cervix (placenta previa).   Having a placenta that separates from the uterus (placental abruption).   Having a cervix that is too weak to hold the fetus in the uterus (incompetent cervix).   Having too much fluid in the amniotic sac (polyhydramnios).   Taking illegal drugs or smoking while pregnant.   Not gaining enough weight while pregnant.   Being younger than 4818 and older than 29 years old.   Having a low socioeconomic status.   Being African American. SYMPTOMS Signs and symptoms of preterm labor include:   Menstrual-like cramps, abdominal pain, or back pain.  Uterine contractions that are regular, as frequent as six in an hour, regardless of their intensity (may be mild or painful).  Contractions that start on the top of the uterus and spread down to the lower abdomen and back.   A sense of increased pelvic  pressure.   A watery or bloody mucus discharge that comes from the vagina.  TREATMENT Depending on the length of the pregnancy and other circumstances, your health care provider may suggest bed rest. If necessary, there are medicines that can be given to stop contractions and to mature the fetal lungs. If labor happens before 34 weeks of pregnancy, a prolonged hospital stay may be recommended. Treatment depends on the condition of both you and the fetus.  WHAT SHOULD YOU DO IF YOU THINK YOU ARE IN PRETERM LABOR? Call your health care provider right away. You will need to go to the hospital to get checked immediately. HOW CAN YOU PREVENT PRETERM LABOR IN FUTURE PREGNANCIES? You should:   Stop smoking if you smoke.  Maintain healthy weight gain and avoid chemicals and drugs that are not necessary.  Be watchful for any type of infection.  Inform your health care provider if you have a known history of preterm labor.   This information is not intended to replace advice given to you by your health care provider. Make sure you discuss any questions you have with your health care provider.   Document Released: 06/01/2003 Document Revised: 11/11/2012 Document Reviewed: 04/13/2012 Elsevier Interactive Patient Education [employer]

## 2015-12-18 NOTE — Progress Notes (Signed)
Work-in Low-risk OB appointment G2P1001 [id number] Estimated Date of Delivery: 03/28/16 BP 112/70   Pulse 80   Wt 185 lb (83.9 kg)   LMP 06/22/2015   BMI 33.84 kg/m   BP, weight, and urine reviewed.  Refer to obstetrical flow sheet for FH & FHR.  Reports good fm.  Denies lof, vb, or uti s/s. Lots of cramping over weekend and again today. States maybe 6 uc's since this am. Feels like she is drinking plenty/staying hydrated, but urine was darker today. Urine culture neg last week, denies s/s uti. Denies abnormal d/c, itching/odor/irritation.  Spec exam: cx visually closed, mod amt thin white nonodorous d/c, wet prep: many wbc's,otherwise neg SVE: LTC Reviewed ptl s/s, fm. Plan:  Continue routine obstetrical care  F/U as scheduled for OB appointment  Increase po fluids enough so urine light yellow/clear Will send urine for gc/ct

## 2015-12-20 LAB — GC/CHLAMYDIA PROBE AMP
Chlamydia trachomatis, NAA: NEGATIVE
Neisseria gonorrhoeae by PCR: NEGATIVE

## 2016-01-03 ENCOUNTER — Ambulatory Visit (INDEPENDENT_AMBULATORY_CARE_PROVIDER_SITE_OTHER): Payer: BLUE CROSS/BLUE SHIELD | Admitting: Advanced Practice Midwife

## 2016-01-03 ENCOUNTER — Encounter: Payer: Self-pay | Admitting: Advanced Practice Midwife

## 2016-01-03 ENCOUNTER — Other Ambulatory Visit: Payer: BLUE CROSS/BLUE SHIELD

## 2016-01-03 VITALS — BP 102/60 | HR 62 | Wt 186.0 lb

## 2016-01-03 DIAGNOSIS — Z131 Encounter for screening for diabetes mellitus: Secondary | ICD-10-CM

## 2016-01-03 DIAGNOSIS — Z3483 Encounter for supervision of other normal pregnancy, third trimester: Secondary | ICD-10-CM

## 2016-01-03 DIAGNOSIS — Z1389 Encounter for screening for other disorder: Secondary | ICD-10-CM

## 2016-01-03 DIAGNOSIS — Z23 Encounter for immunization: Secondary | ICD-10-CM

## 2016-01-03 DIAGNOSIS — Z3482 Encounter for supervision of other normal pregnancy, second trimester: Secondary | ICD-10-CM

## 2016-01-03 DIAGNOSIS — Z3A28 28 weeks gestation of pregnancy: Secondary | ICD-10-CM

## 2016-01-03 DIAGNOSIS — Z331 Pregnant state, incidental: Secondary | ICD-10-CM

## 2016-01-03 LAB — POCT URINALYSIS DIPSTICK
Blood, UA: NEGATIVE
Glucose, UA: NEGATIVE
NITRITE UA: NEGATIVE
Protein, UA: NEGATIVE

## 2016-01-03 MED ORDER — LIDOCAINE 5 % EX PTCH: 1.0000 | MEDICATED_PATCH | CUTANEOUS | 0 refills | Status: DC

## 2016-01-03 NOTE — Patient Instructions (Addendum)
Third Trimester of Pregnancy The third trimester is from week 29 through week 42, months 7 through 9. The third trimester is a time when the fetus is growing rapidly. At the end of the ninth month, the fetus is about 20 inches in length and weighs 6-10 pounds.  BODY CHANGES Your body goes through many changes during pregnancy. The changes vary from woman to woman.   Your weight will continue to increase. You can expect to gain 25-35 pounds (11-16 kg) by the end of the pregnancy.  You may begin to get stretch marks on your hips, abdomen, and breasts.  You may urinate more often because the fetus is moving lower into your pelvis and pressing on your bladder.  You may develop or continue to have heartburn as a result of your pregnancy.  You may develop constipation because certain hormones are causing the muscles that push waste through your intestines to slow down.  You may develop hemorrhoids or swollen, bulging veins (varicose veins).  You may have pelvic pain because of the weight gain and pregnancy hormones relaxing your joints between the bones in your pelvis. Backaches may result from overexertion of the muscles supporting your posture.  You may have changes in your hair. These can include thickening of your hair, rapid growth, and changes in texture. Some women also have hair loss during or after pregnancy, or hair that feels dry or thin. Your hair will most likely return to normal after your baby is born.  Your breasts will continue to grow and be tender. A yellow discharge may leak from your breasts called colostrum.  Your belly button may stick out.  You may feel short of breath because of your expanding uterus.  You may notice the fetus "dropping," or moving lower in your abdomen.  You may have a bloody mucus discharge. This usually occurs a few days to a week before labor begins.  Your cervix becomes thin and soft (effaced) near your due date. WHAT TO EXPECT AT YOUR PRENATAL  EXAMS  You will have prenatal exams every 2 weeks until week 36. Then, you will have weekly prenatal exams. During a routine prenatal visit:  You will be weighed to make sure you and the fetus are growing normally.  Your blood pressure is taken.  Your abdomen will be measured to track your baby's growth.  The fetal heartbeat will be listened to.  Any test results from the previous visit will be discussed.  You may have a cervical check near your due date to see if you have effaced. At around 36 weeks, your caregiver will check your cervix. At the same time, your caregiver will also perform a test on the secretions of the vaginal tissue. This test is to determine if a type of bacteria, Group B streptococcus, is present. Your caregiver will explain this further. Your caregiver may ask you:  What your birth plan is.  How you are feeling.  If you are feeling the baby move.  If you have had any abnormal symptoms, such as leaking fluid, bleeding, severe headaches, or abdominal cramping.  If you are using any tobacco products, including cigarettes, chewing tobacco, and electronic cigarettes.  If you have any questions. Other tests or screenings that may be performed during your third trimester include:  Blood tests that check for low iron levels (anemia).  Fetal testing to check the health, activity level, and growth of the fetus. Testing is done if you have certain medical conditions or if   there are problems during the pregnancy.  HIV (human immunodeficiency virus) testing. If you are at high risk, you may be screened for HIV during your third trimester of pregnancy. FALSE LABOR You may feel small, irregular contractions that eventually go away. These are called Braxton Hicks contractions, or false labor. Contractions may last for hours, days, or even weeks before true labor sets in. If contractions come at regular intervals, intensify, or become painful, it is best to be seen by your  caregiver.  SIGNS OF LABOR   Menstrual-like cramps.  Contractions that are 5 minutes apart or less.  Contractions that start on the top of the uterus and spread down to the lower abdomen and back.  A sense of increased pelvic pressure or back pain.  A watery or bloody mucus discharge that comes from the vagina. If you have any of these signs before the 37th week of pregnancy, call your caregiver right away. You need to go to the hospital to get checked immediately. HOME CARE INSTRUCTIONS   Avoid all smoking, herbs, alcohol, and unprescribed drugs. These chemicals affect the formation and growth of the baby.  Do not use any tobacco products, including cigarettes, chewing tobacco, and electronic cigarettes. If you need help quitting, ask your health care provider. You may receive counseling support and other resources to help you quit.  Follow your caregiver's instructions regarding medicine use. There are medicines that are either safe or unsafe to take during pregnancy.  Exercise only as directed by your caregiver. Experiencing uterine cramps is a good sign to stop exercising.  Continue to eat regular, healthy meals.  Wear a good support bra for breast tenderness.  Do not use hot tubs, steam rooms, or saunas.  Wear your seat belt at all times when driving.  Avoid raw meat, uncooked cheese, cat litter boxes, and soil used by cats. These carry germs that can cause birth defects in the baby.  Take your prenatal vitamins.  Take 1500-2000 mg of calcium daily starting at the 20th week of pregnancy until you deliver your baby.  Try taking a stool softener (if your caregiver approves) if you develop constipation. Eat more high-fiber foods, such as fresh vegetables or fruit and whole grains. Drink plenty of fluids to keep your urine clear or pale yellow.  Take warm sitz baths to soothe any pain or discomfort caused by hemorrhoids. Use hemorrhoid cream if your caregiver approves.  If  you develop varicose veins, wear support hose. Elevate your feet for 15 minutes, 3-4 times a day. Limit salt in your diet.  Avoid heavy lifting, wear low heal shoes, and practice good posture.  Rest a lot with your legs elevated if you have leg cramps or low back pain.  Visit your dentist if you have not gone during your pregnancy. Use a soft toothbrush to brush your teeth and be gentle when you floss.  A sexual relationship may be continued unless your caregiver directs you otherwise.  Do not travel far distances unless it is absolutely necessary and only with the approval of your caregiver.  Take prenatal classes to understand, practice, and ask questions about the labor and delivery.  Make a trial run to the hospital.  Pack your hospital bag.  Prepare the baby's nursery.  Continue to go to all your prenatal visits as directed by your caregiver. SEEK MEDICAL CARE IF:  You are unsure if you are in labor or if your water has broken.  You have dizziness.  You have  mild pelvic cramps, pelvic pressure, or nagging pain in your abdominal area.  You have persistent nausea, vomiting, or diarrhea.  You have a bad smelling vaginal discharge.  You have pain with urination. SEEK IMMEDIATE MEDICAL CARE IF:   You have a fever.  You are leaking fluid from your vagina.  You have spotting or bleeding from your vagina.  You have severe abdominal cramping or pain.  You have rapid weight loss or gain.  You have shortness of breath with chest pain.  You notice sudden or extreme swelling of your face, hands, ankles, feet, or legs.  You have not felt your baby move in over an hour.  You have severe headaches that do not go away with medicine.  You have vision changes.   This information is not intended to replace advice given to you by your health care provider. Make sure you discuss any questions you have with your health care provider.   Document Released: 03/05/2001 Document  Revised: 04/01/2014 Document Reviewed: 05/12/2012 [employer] Interactive Patient Education 2016 [employer] Sciatica With Rehab The sciatic nerve runs from the back down the leg and is responsible for sensation and control of the muscles in the back (posterior) side of the thigh, lower leg, and foot. Sciatica is a condition that is characterized by inflammation of this nerve.  SYMPTOMS   Signs of nerve damage, including numbness and/or weakness along the posterior side of the lower extremity.  Pain in the back of the thigh that may also travel down the leg.  Pain that worsens when sitting for long periods of time.  Occasionally, pain in the back or buttock. CAUSES  Inflammation of the sciatic nerve is the cause of sciatica. The inflammation is due to something irritating the nerve. Common sources of irritation include:  Sitting for long periods of time.  Direct trauma to the nerve.  Arthritis of the spine.  Herniated or ruptured disk.  Slipping of the vertebrae (spondylolisthesis).  Pressure from soft tissues, such as muscles or ligament-like tissue (fascia). RISK INCREASES WITH:  Sports that place pressure or stress on the spine (football or weightlifting).  Poor strength and flexibility.  Failure to warm up properly before activity.  Family history of low back pain or disk disorders.  Previous back injury or surgery.  Poor body mechanics, especially when lifting, or poor posture. PREVENTION   Warm up and stretch properly before activity.  Maintain physical fitness:  Strength, flexibility, and endurance.  Cardiovascular fitness.  Learn and use proper technique, especially with posture and lifting. When possible, have coach correct improper technique.  Avoid activities that place stress on the spine. PROGNOSIS If treated properly, then sciatica usually resolves within 6 weeks. However, occasionally surgery is necessary.  RELATED COMPLICATIONS   Permanent nerve  damage, including pain, numbness, tingle, or weakness.  Chronic back pain.  Risks of surgery: infection, bleeding, nerve damage, or damage to surrounding tissues. TREATMENT Treatment initially involves resting from any activities that aggravate your symptoms. The use of ice and medication may help reduce pain and inflammation. The use of strengthening and stretching exercises may help reduce pain with activity. These exercises may be performed at home or with referral to a therapist. A therapist may recommend further treatments, such as transcutaneous electronic nerve stimulation (TENS) or ultrasound. Your caregiver may recommend corticosteroid injections to help reduce inflammation of the sciatic nerve. If symptoms persist despite non-surgical (conservative) treatment, then surgery may be recommended. MEDICATION  If pain medication is necessary, then  nonsteroidal anti-inflammatory medications, such as aspirin and ibuprofen, or other minor pain relievers, such as acetaminophen, are often recommended.  Do not take pain medication for 7 days before surgery.  Prescription pain relievers may be given if deemed necessary by your caregiver. Use only as directed and only as much as you need.  Ointments applied to the skin may be helpful.  Corticosteroid injections may be given by your caregiver. These injections should be reserved for the most serious cases, because they may only be given a certain number of times. HEAT AND COLD  Cold treatment (icing) relieves pain and reduces inflammation. Cold treatment should be applied for 10 to 15 minutes every 2 to 3 hours for inflammation and pain and immediately after any activity that aggravates your symptoms. Use ice packs or massage the area with a piece of ice (ice massage).  Heat treatment may be used prior to performing the stretching and strengthening activities prescribed by your caregiver, physical therapist, or athletic trainer. Use a heat pack or  soak the injury in warm water. SEEK MEDICAL CARE IF:  Treatment seems to offer no benefit, or the condition worsens.  Any medications produce adverse side effects. EXERCISES  RANGE OF MOTION (ROM) AND STRETCHING EXERCISES - Sciatica Most people with sciatic will find that their symptoms worsen with either excessive bending forward (flexion) or arching at the low back (extension). The exercises which will help resolve your symptoms will focus on the opposite motion. Your physician, physical therapist or athletic trainer will help you determine which exercises will be most helpful to resolve your low back pain. Do not complete any exercises without first consulting with your clinician. Discontinue any exercises which worsen your symptoms until you speak to your clinician. If you have pain, numbness or tingling which travels down into your buttocks, leg or foot, the goal of the therapy is for these symptoms to move closer to your back and eventually resolve. Occasionally, these leg symptoms will get better, but your low back pain may worsen; this is typically an indication of progress in your rehabilitation. Be certain to be very alert to any changes in your symptoms and the activities in which you participated in the 24 hours prior to the change. Sharing this information with your clinician will allow him/her to most efficiently treat your condition. These exercises may help you when beginning to rehabilitate your injury. Your symptoms may resolve with or without further involvement from your physician, physical therapist or athletic trainer. While completing these exercises, remember:   Restoring tissue flexibility helps normal motion to return to the joints. This allows healthier, less painful movement and activity.  An effective stretch should be held for at least 30 seconds.  A stretch should never be painful. You should only feel a gentle lengthening or release in the stretched tissue. FLEXION  RANGE OF MOTION AND STRETCHING EXERCISES: STRETCH - Flexion, Single Knee to Chest   Lie on a firm bed or floor with both legs extended in front of you.  Keeping one leg in contact with the floor, bring your opposite knee to your chest. Hold your leg in place by either grabbing behind your thigh or at your knee.  Pull until you feel a gentle stretch in your low back. Hold __________ seconds.  Slowly release your grasp and repeat the exercise with the opposite side. Repeat __________ times. Complete this exercise __________ times per day.  STRETCH - Flexion, Double Knee to Chest  Lie on a firm bed  or floor with both legs extended in front of you.  Keeping one leg in contact with the floor, bring your opposite knee to your chest.  Tense your stomach muscles to support your back and then lift your other knee to your chest. Hold your legs in place by either grabbing behind your thighs or at your knees.  Pull both knees toward your chest until you feel a gentle stretch in your low back. Hold __________ seconds.  Tense your stomach muscles and slowly return one leg at a time to the floor. Repeat __________ times. Complete this exercise __________ times per day.  STRETCH - Low Trunk Rotation   Lie on a firm bed or floor. Keeping your legs in front of you, bend your knees so they are both pointed toward the ceiling and your feet are flat on the floor.  Extend your arms out to the side. This will stabilize your upper body by keeping your shoulders in contact with the floor.  Gently and slowly drop both knees together to one side until you feel a gentle stretch in your low back. Hold for __________ seconds.  Tense your stomach muscles to support your low back as you bring your knees back to the starting position. Repeat the exercise to the other side. Repeat __________ times. Complete this exercise __________ times per day  EXTENSION RANGE OF MOTION AND FLEXIBILITY EXERCISES: STRETCH -  Extension, Prone on Elbows  Lie on your stomach on the floor, a bed will be too soft. Place your palms about shoulder width apart and at the height of your head.  Place your elbows under your shoulders. If this is too painful, stack pillows under your chest.  Allow your body to relax so that your hips drop lower and make contact more completely with the floor.  Hold this position for __________ seconds.  Slowly return to lying flat on the floor. Repeat __________ times. Complete this exercise __________ times per day.  RANGE OF MOTION - Extension, Prone Press Ups  Lie on your stomach on the floor, a bed will be too soft. Place your palms about shoulder width apart and at the height of your head.  Keeping your back as relaxed as possible, slowly straighten your elbows while keeping your hips on the floor. You may adjust the placement of your hands to maximize your comfort. As you gain motion, your hands will come more underneath your shoulders.  Hold this position __________ seconds.  Slowly return to lying flat on the floor. Repeat __________ times. Complete this exercise __________ times per day.  STRENGTHENING EXERCISES - Sciatica  These exercises may help you when beginning to rehabilitate your injury. These exercises should be done near your "sweet spot." This is the neutral, low-back arch, somewhere between fully rounded and fully arched, that is your least painful position. When performed in this safe range of motion, these exercises can be used for people who have either a flexion or extension based injury. These exercises may resolve your symptoms with or without further involvement from your physician, physical therapist or athletic trainer. While completing these exercises, remember:   Muscles can gain both the endurance and the strength needed for everyday activities through controlled exercises.  Complete these exercises as instructed by your physician, physical therapist or  athletic trainer. Progress with the resistance and repetition exercises only as your caregiver advises.  You may experience muscle soreness or fatigue, but the pain or discomfort you are trying to eliminate should never  worsen during these exercises. If this pain does worsen, stop and make certain you are following the directions exactly. If the pain is still present after adjustments, discontinue the exercise until you can discuss the trouble with your clinician. STRENGTHENING - Deep Abdominals, Pelvic Tilt   Lie on a firm bed or floor. Keeping your legs in front of you, bend your knees so they are both pointed toward the ceiling and your feet are flat on the floor.  Tense your lower abdominal muscles to press your low back into the floor. This motion will rotate your pelvis so that your tail bone is scooping upwards rather than pointing at your feet or into the floor.  With a gentle tension and even breathing, hold this position for __________ seconds. Repeat __________ times. Complete this exercise __________ times per day.  STRENGTHENING - Abdominals, Crunches   Lie on a firm bed or floor. Keeping your legs in front of you, bend your knees so they are both pointed toward the ceiling and your feet are flat on the floor. Cross your arms over your chest.  Slightly tip your chin down without bending your neck.  Tense your abdominals and slowly lift your trunk high enough to just clear your shoulder blades. Lifting higher can put excessive stress on the low back and does not further strengthen your abdominal muscles.  Control your return to the starting position. Repeat __________ times. Complete this exercise __________ times per day.  STRENGTHENING - Quadruped, Opposite UE/LE Lift  Assume a hands and knees position on a firm surface. Keep your hands under your shoulders and your knees under your hips. You may place padding under your knees for comfort.  Find your neutral spine and gently  tense your abdominal muscles so that you can maintain this position. Your shoulders and hips should form a rectangle that is parallel with the floor and is not twisted.  Keeping your trunk steady, lift your right hand no higher than your shoulder and then your left leg no higher than your hip. Make sure you are not holding your breath. Hold this position __________ seconds.  Continuing to keep your abdominal muscles tense and your back steady, slowly return to your starting position. Repeat with the opposite arm and leg. Repeat __________ times. Complete this exercise __________ times per day.  STRENGTHENING - Abdominals and Quadriceps, Straight Leg Raise   Lie on a firm bed or floor with both legs extended in front of you.  Keeping one leg in contact with the floor, bend the other knee so that your foot can rest flat on the floor.  Find your neutral spine, and tense your abdominal muscles to maintain your spinal position throughout the exercise.  Slowly lift your straight leg off the floor about 6 inches for a count of 15, making sure to not hold your breath.  Still keeping your neutral spine, slowly lower your leg all the way to the floor. Repeat this exercise with each leg __________ times. Complete this exercise __________ times per day. POSTURE AND BODY MECHANICS CONSIDERATIONS - Sciatica Keeping correct posture when sitting, standing or completing your activities will reduce the stress put on different body tissues, allowing injured tissues a chance to heal and limiting painful experiences. The following are general guidelines for improved posture. Your physician or physical therapist will provide you with any instructions specific to your needs. While reading these guidelines, remember:  The exercises prescribed by your provider will help you have the flexibility and  strength to maintain correct postures.  The correct posture provides the optimal environment for your joints to work. All  of your joints have less wear and tear when properly supported by a spine with good posture. This means you will experience a healthier, less painful body.  Correct posture must be practiced with all of your activities, especially prolonged sitting and standing. Correct posture is as important when doing repetitive low-stress activities (typing) as it is when doing a single heavy-load activity (lifting). RESTING POSITIONS Consider which positions are most painful for you when choosing a resting position. If you have pain with flexion-based activities (sitting, bending, stooping, squatting), choose a position that allows you to rest in a less flexed posture. You would want to avoid curling into a fetal position on your side. If your pain worsens with extension-based activities (prolonged standing, working overhead), avoid resting in an extended position such as sleeping on your stomach. Most people will find more comfort when they rest with their spine in a more neutral position, neither too rounded nor too arched. Lying on a non-sagging bed on your side with a pillow between your knees, or on your back with a pillow under your knees will often provide some relief. Keep in mind, being in any one position for a prolonged period of time, no matter how correct your posture, can still lead to stiffness. PROPER SITTING POSTURE In order to minimize stress and discomfort on your spine, you must sit with correct posture Sitting with good posture should be effortless for a healthy body. Returning to good posture is a gradual process. Many people can work toward this most comfortably by using various supports until they have the flexibility and strength to maintain this posture on their own. When sitting with proper posture, your ears will fall over your shoulders and your shoulders will fall over your hips. You should use the back of the chair to support your upper back. Your low back will be in a neutral position,  just slightly arched. You may place a small pillow or folded towel at the base of your low back for support.  When working at a desk, create an environment that supports good, upright posture. Without extra support, muscles fatigue and lead to excessive strain on joints and other tissues. Keep these recommendations in mind: CHAIR:   A chair should be able to slide under your desk when your back makes contact with the back of the chair. This allows you to work closely.  The chair's height should allow your eyes to be level with the upper part of your monitor and your hands to be slightly lower than your elbows. BODY POSITION  Your feet should make contact with the floor. If this is not possible, use a foot rest.  Keep your ears over your shoulders. This will reduce stress on your neck and low back. INCORRECT SITTING POSTURES   If you are feeling tired and unable to assume a healthy sitting posture, do not slouch or slump. This puts excessive strain on your back tissues, causing more damage and pain. Healthier options include:  Using more support, like a lumbar pillow.  Switching tasks to something that requires you to be upright or walking.  Talking a brief walk.  Lying down to rest in a neutral-spine position. PROLONGED STANDING WHILE SLIGHTLY LEANING FORWARD  When completing a task that requires you to lean forward while standing in one place for a long time, place either foot up on a stationary  2-4 inch high object to help maintain the best posture. When both feet are on the ground, the low back tends to lose its slight inward curve. If this curve flattens (or becomes too large), then the back and your other joints will experience too much stress, fatigue more quickly and can cause pain.  CORRECT STANDING POSTURES Proper standing posture should be assumed with all daily activities, even if they only take a few moments, like when brushing your teeth. As in sitting, your ears should fall  over your shoulders and your shoulders should fall over your hips. You should keep a slight tension in your abdominal muscles to brace your spine. Your tailbone should point down to the ground, not behind your body, resulting in an over-extended swayback posture.  INCORRECT STANDING POSTURES  Common incorrect standing postures include a forward head, locked knees and/or an excessive swayback. WALKING Walk with an upright posture. Your ears, shoulders and hips should all line-up. PROLONGED ACTIVITY IN A FLEXED POSITION When completing a task that requires you to bend forward at your waist or lean over a low surface, try to find a way to stabilize 3 of 4 of your limbs. You can place a hand or elbow on your thigh or rest a knee on the surface you are reaching across. This will provide you more stability so that your muscles do not fatigue as quickly. By keeping your knees relaxed, or slightly bent, you will also reduce stress across your low back. CORRECT LIFTING TECHNIQUES DO :   Assume a wide stance. This will provide you more stability and the opportunity to get as close as possible to the object which you are lifting.  Tense your abdominals to brace your spine; then bend at the knees and hips. Keeping your back locked in a neutral-spine position, lift using your leg muscles. Lift with your legs, keeping your back straight.  Test the weight of unknown objects before attempting to lift them.  Try to keep your elbows locked down at your sides in order get the best strength from your shoulders when carrying an object.  Always ask for help when lifting heavy or awkward objects. INCORRECT LIFTING TECHNIQUES DO NOT:   Lock your knees when lifting, even if it is a small object.  Bend and twist. Pivot at your feet or move your feet when needing to change directions.  Assume that you cannot safely pick up a paperclip without proper posture.   This information is not intended to replace advice  given to you by your health care provider. Make sure you discuss any questions you have with your health care provider.   Document Released: 03/11/2005 Document Revised: 07/26/2014 Document Reviewed: 06/23/2008 Elsevier Interactive Patient Education [employer]

## 2016-01-03 NOTE — Progress Notes (Signed)
G2P1001 7267w6d Estimated Date of Delivery: 03/28/16  Blood pressure 102/60, pulse 62, weight 186 lb (84.4 kg), last menstrual period 06/22/2015.   BP weight and urine results all reviewed and noted.  Please refer to the obstetrical flow sheet for the fundal height and fetal heart rate documentation:  Patient reports good fetal movement, denies any bleeding and no rupture of membranes symptoms or regular contractions. Patient c/o hip pain/sciatica. Tips given, rx lidocaine pat All questions were answered.  Orders Placed This Encounter  Procedures  . POCT urinalysis dipstick    Plan:  Continued routine obstetrical care, PN2 today   Return in about 3 weeks (around 01/24/2016) for LROB.

## 2016-01-04 LAB — GLUCOSE TOLERANCE, 2 HOURS W/ 1HR
GLUCOSE, 1 HOUR: 166 mg/dL (ref 65–179)
GLUCOSE, 2 HOUR: 147 mg/dL (ref 65–152)
GLUCOSE, FASTING: 63 mg/dL — AB (ref 65–91)

## 2016-01-04 LAB — CBC
HEMOGLOBIN: 10.9 g/dL — AB (ref 11.1–15.9)
Hematocrit: 33.2 % — ABNORMAL LOW (ref 34.0–46.6)
MCH: 30.4 pg (ref 26.6–33.0)
MCHC: 32.8 g/dL (ref 31.5–35.7)
MCV: 93 fL (ref 79–97)
Platelets: 197 10*3/uL (ref 150–379)
RBC: 3.58 x10E6/uL — AB (ref 3.77–5.28)
RDW: 13.3 % (ref 12.3–15.4)
WBC: 10.7 10*3/uL (ref 3.4–10.8)

## 2016-01-04 LAB — RPR: RPR Ser Ql: NONREACTIVE

## 2016-01-04 LAB — ANTIBODY SCREEN: Antibody Screen: NEGATIVE

## 2016-01-04 LAB — HIV ANTIBODY (ROUTINE TESTING W REFLEX): HIV SCREEN 4TH GENERATION: NONREACTIVE

## 2016-01-09 ENCOUNTER — Telehealth: Payer: Self-pay | Admitting: *Deleted

## 2016-01-09 ENCOUNTER — Ambulatory Visit (INDEPENDENT_AMBULATORY_CARE_PROVIDER_SITE_OTHER): Payer: BLUE CROSS/BLUE SHIELD | Admitting: Women's Health

## 2016-01-09 ENCOUNTER — Encounter: Payer: Self-pay | Admitting: Women's Health

## 2016-01-09 VITALS — BP 114/64 | HR 76 | Wt 184.0 lb

## 2016-01-09 DIAGNOSIS — R109 Unspecified abdominal pain: Secondary | ICD-10-CM

## 2016-01-09 DIAGNOSIS — Z3483 Encounter for supervision of other normal pregnancy, third trimester: Secondary | ICD-10-CM

## 2016-01-09 DIAGNOSIS — Z1389 Encounter for screening for other disorder: Secondary | ICD-10-CM

## 2016-01-09 DIAGNOSIS — Z331 Pregnant state, incidental: Secondary | ICD-10-CM

## 2016-01-09 DIAGNOSIS — O26899 Other specified pregnancy related conditions, unspecified trimester: Secondary | ICD-10-CM | POA: Diagnosis not present

## 2016-01-09 DIAGNOSIS — Z3A29 29 weeks gestation of pregnancy: Secondary | ICD-10-CM

## 2016-01-09 LAB — POCT URINALYSIS DIPSTICK
GLUCOSE UA: NEGATIVE
Ketones, UA: NEGATIVE
NITRITE UA: NEGATIVE
RBC UA: NEGATIVE

## 2016-01-09 LAB — POCT WET PREP (WET MOUNT)
CLUE CELLS WET PREP WHIFF POC: NEGATIVE
TRICHOMONAS WET PREP HPF POC: ABSENT

## 2016-01-09 NOTE — Progress Notes (Signed)
Work-in  Low-risk OB appointment G2P1001 5862w5d Estimated Date of Delivery: 03/28/16 BP 114/64   Pulse 76   Wt 184 lb (83.5 kg)   LMP 06/22/2015   BMI 33.65 kg/m   BP, weight, and urine reviewed.  Refer to obstetrical flow sheet for FH & FHR.  Reports good fm.  Denies lof, vb, or uti s/s. N/V/D since Sun. Hasn't vomited since yest- still feels nauseated- declines meds. ~4-5 diarrheal stools/day. [facility] d/t cramping. NO uc's on efm per pt, cx not checked. Vag d/c has been thick lately, no itching/irritation/odor.  Spec exam: cx visually closed, mod amt light yellow nonodorous d/c, wet prep mod wbc's, otherwise neg Cx appears long and closed, SVE deferred Will send urine cx and gc/ct Reviewed ptl s/s, fkc, normal pn2 results. Likely slightly dehydrated from GI bug-to stay well hydrated. Take imodium if needed for diarrhea.  Plan:  Continue routine obstetrical care  F/U as scheduled for OB appointment

## 2016-01-09 NOTE — Patient Instructions (Signed)
Imodium if needed for diarrhea Stay hydrated  Call the office [phone]) or go to [facility] if:  You begin to have strong, frequent contractions  Your water breaks.  Sometimes it is a big gush of fluid, sometimes it is just a trickle that keeps getting your panties wet or running down your legs  You have vaginal bleeding.  It is normal to have a small amount of spotting if your cervix was checked.   You don't feel your baby moving like normal.  If you don't, get you something to eat and drink and lay down and focus on feeling your baby move.  You should feel at least 10 movements in 2 hours.  If you don't, you should call the office or [facility] to [facility].    Preterm Labor Information Preterm labor is when labor starts at less than 37 weeks of pregnancy. The normal length of a pregnancy is 39 to 41 weeks. CAUSES Often, there is no identifiable underlying cause as to why a woman goes into preterm labor. One of the most common known causes of preterm labor is infection. Infections of the uterus, cervix, vagina, amniotic sac, bladder, kidney, or even the lungs (pneumonia) can cause labor to start. Other suspected causes of preterm labor include:   Urogenital infections, such as yeast infections and bacterial vaginosis.   Uterine abnormalities (uterine shape, uterine septum, fibroids, or bleeding from the placenta).   A cervix that has been operated on (it may fail to stay closed).   Malformations in the fetus.   Multiple gestations (twins, triplets, and so on).   Breakage of the amniotic sac.  RISK FACTORS  Having a previous history of preterm labor.   Having premature rupture of membranes (PROM).   Having a placenta that covers the opening of the cervix (placenta previa).   Having a placenta that separates from the uterus (placental abruption).   Having a cervix that is too weak to hold the fetus in the uterus (incompetent cervix).   Having too much fluid  in the amniotic sac (polyhydramnios).   Taking illegal drugs or smoking while pregnant.   Not gaining enough weight while pregnant.   Being younger than 5918 and older than 29 years old.   Having a low socioeconomic status.   Being African American. SYMPTOMS Signs and symptoms of preterm labor include:   Menstrual-like cramps, abdominal pain, or back pain.  Uterine contractions that are regular, as frequent as six in an hour, regardless of their intensity (may be mild or painful).  Contractions that start on the top of the uterus and spread down to the lower abdomen and back.   A sense of increased pelvic pressure.   A watery or bloody mucus discharge that comes from the vagina.  TREATMENT Depending on the length of the pregnancy and other circumstances, your health care provider may suggest bed rest. If necessary, there are medicines that can be given to stop contractions and to mature the fetal lungs. If labor happens before 34 weeks of pregnancy, a prolonged hospital stay may be recommended. Treatment depends on the condition of both you and the fetus.  WHAT SHOULD YOU DO IF YOU THINK YOU ARE IN PRETERM LABOR? Call your health care provider right away. You will need to go to the hospital to get checked immediately. HOW CAN YOU PREVENT PRETERM LABOR IN FUTURE PREGNANCIES? You should:   Stop smoking if you smoke.  Maintain healthy weight gain and avoid chemicals and drugs  that are not necessary.  Be watchful for any type of infection.  Inform your health care provider if you have a known history of preterm labor.   This information is not intended to replace advice given to you by your health care provider. Make sure you discuss any questions you have with your health care provider.   Document Released: 06/01/2003 Document Revised: 11/11/2012 Document Reviewed: 04/13/2012 Elsevier Interactive Patient Education [employer] Elsevier Inc.

## 2016-01-09 NOTE — Telephone Encounter (Signed)
Pt c/o diarrhea, nausea, cramping 4 times within an hour, +FM, no vaginal bleeding. Pt given an appt for evaluation today.

## 2016-01-11 LAB — URINE CULTURE

## 2016-01-11 LAB — GC/CHLAMYDIA PROBE AMP
CHLAMYDIA, DNA PROBE: NEGATIVE
NEISSERIA GONORRHOEAE BY PCR: NEGATIVE

## 2016-01-24 ENCOUNTER — Ambulatory Visit (INDEPENDENT_AMBULATORY_CARE_PROVIDER_SITE_OTHER): Payer: BLUE CROSS/BLUE SHIELD | Admitting: Women's Health

## 2016-01-24 ENCOUNTER — Encounter: Payer: Self-pay | Admitting: Women's Health

## 2016-01-24 VITALS — BP 110/52 | HR 80 | Wt 191.0 lb

## 2016-01-24 DIAGNOSIS — Z331 Pregnant state, incidental: Secondary | ICD-10-CM

## 2016-01-24 DIAGNOSIS — Z3483 Encounter for supervision of other normal pregnancy, third trimester: Secondary | ICD-10-CM

## 2016-01-24 DIAGNOSIS — Z1389 Encounter for screening for other disorder: Secondary | ICD-10-CM

## 2016-01-24 DIAGNOSIS — Z3A31 31 weeks gestation of pregnancy: Secondary | ICD-10-CM

## 2016-01-24 LAB — POCT URINALYSIS DIPSTICK
GLUCOSE UA: NEGATIVE
Ketones, UA: NEGATIVE
NITRITE UA: NEGATIVE
Protein, UA: NEGATIVE
RBC UA: NEGATIVE

## 2016-01-24 MED ORDER — FERROUS SULFATE 325 (65 FE) MG PO TABS: 325.0000 mg | ORAL_TABLET | Freq: Two times a day (BID) | ORAL | 3 refills | Status: AC

## 2016-01-24 NOTE — Patient Instructions (Addendum)
Tips to Help Leg Cramps  Increase dietary sources of calcium (milk, yogurt, cheese, leafy greens, seafood, legumes, and fruit) and magnesium (dark leafy greens, nuts, seeds, fish, beans, whole grains, avocados, yogurt, bananas, dried fruit, dark chocolate)  Spoonful of regular yellow mustard every night  Pickle juice  Magnesium supplement: 57mol in the morning, 126ml at night (can find in the vitamin aisle)  Dorsiflexion of foot: pointing your toes back towards your knee during the cramp       Tips to Help You Sleep Better:   Get into a bedtime routine, try to do the same thing every night before going to bed to try to help your body wind down  Warm baths  Avoid caffeine for at least 3 hours before going to sleep   Keep your room at a slightly cooler temperature, can try running a fan  Turn off TV, lights, phone, electronics  Lots of pillows if needed to help you get comfortable  Lavender scented items can help you sleep. You can place lavender essential oil on a cotton ball and place under your pillowcase, or place in a diffuser. [employer] a lavender scented sleep line (plug-ins, sprays, etc). Look in the pillow aisle for lavender scented pillows.   If none of the above things help, you can try 1/2 to 1 tablet of benadryl, unisom, or tylenol pm. Do not take this every night, only when you really need it.   Call the office ([phone] [facility]:  You begin to have strong, frequent contractions  Your water breaks.  Sometimes it is a big gush of fluid, sometimes it is just a trickle that keeps getting your panties wet or running down your legs  You have vaginal bleeding.  It is normal to have a small amount of spotting if your cervix was checked.   You don't feel your baby moving like normal.  If you don't, get you something to eat and drink and lay down and focus on feeling your baby move.  You should feel at least 10 movements in 2 hours.  If you don't,  you should call the office or go to [facility] is when labor starts at less than 37 weeks of pregnancy. The normal length of a pregnancy is 39 to 41 weeks. CAUSES Often, there is no identifiable underlying cause as to why a woman goes into preterm labor. One of the most common known causes of preterm labor is infection. Infections of the uterus, cervix, vagina, amniotic sac, bladder, kidney, or even the lungs (pneumonia) can cause labor to start. Other suspected causes of preterm labor include:   Urogenital infections, such as yeast infections and bacterial vaginosis.   Uterine abnormalities (uterine shape, uterine septum, fibroids, or bleeding from the placenta).   A cervix that has been operated on (it may fail to stay closed).   Malformations in the fetus.   Multiple gestations (twins, triplets, and so on).   Breakage of the amniotic sac.  RISK FACTORS  Having a previous history of preterm labor.   Having premature rupture of membranes (PROM).   Having a placenta that covers the opening of the cervix (placenta previa).   Having a placenta that separates from the uterus (placental abruption).   Having a cervix that is too weak to hold the fetus in the uterus (incompetent cervix).   Having too much fluid in the amniotic sac (polyhydramnios).   Taking  illegal drugs or smoking while pregnant.   Not gaining enough weight while pregnant.   Being younger than 28 and older than 29 years old.   Having a low socioeconomic status.   Being African American. SYMPTOMS Signs and symptoms of preterm labor include:   Menstrual-like cramps, abdominal pain, or back pain.  Uterine contractions that are regular, as frequent as six in an hour, regardless of their intensity (may be mild or painful).  Contractions that start on the top of the uterus and spread down to the lower abdomen and back.   A sense of increased  pelvic pressure.   A watery or bloody mucus discharge that comes from the vagina.  TREATMENT Depending on the length of the pregnancy and other circumstances, your health care provider may suggest bed rest. If necessary, there are medicines that can be given to stop contractions and to mature the fetal lungs. If labor happens before 34 weeks of pregnancy, a prolonged hospital stay may be recommended. Treatment depends on the condition of both you and the fetus.  WHAT SHOULD YOU DO IF YOU THINK YOU ARE IN PRETERM LABOR? Call your health care provider right away. You will need to go to the hospital to get checked immediately. HOW CAN YOU PREVENT PRETERM LABOR IN FUTURE PREGNANCIES? You should:   Stop smoking if you smoke.  Maintain healthy weight gain and avoid chemicals and drugs that are not necessary.  Be watchful for any type of infection.  Inform your health care provider if you have a known history of preterm labor.   This information is not intended to replace advice given to you by your health care provider. Make sure you discuss any questions you have with your health care provider.   Document Released: 06/01/2003 Document Revised: 11/11/2012 Document Reviewed: 04/13/2012 Elsevier Interactive Patient Education 2016 Elsevier Inc.  Iron-Rich Diet Iron is a mineral that helps your body to produce hemoglobin. Hemoglobin is a protein in your red blood cells that carries oxygen to your body's tissues. Eating too little iron may cause you to feel weak and tired, and it can increase your risk for infection. Eating enough iron is necessary for your body's metabolism, muscle function, and nervous system. Iron is naturally found in many foods. It can also be added to foods or fortified in foods. There are two types of dietary iron:  Heme iron. Heme iron is absorbed by the body more easily than nonheme iron. Heme iron is found in meat, poultry, and fish.  Nonheme iron. Nonheme iron is  found in dietary supplements, iron-fortified grains, beans, and vegetables. You may need to follow an iron-rich diet if:  You have been diagnosed with iron deficiency or iron-deficiency anemia.  You have a condition that prevents you from absorbing dietary iron, such as:  Infection in your intestines.  Celiac disease. This involves long-lasting (chronic) inflammation of your intestines.  You do not eat enough iron.  You eat a diet that is high in foods that impair iron absorption.  You have lost a lot of blood.  You have heavy bleeding during your menstrual cycle.  You are pregnant. WHAT IS MY PLAN? Your health care provider may help you to determine how much iron you need per day based on your condition. Generally, when a person consumes sufficient amounts of iron in the diet, the following iron needs are met:  Men.  58-63 years old: 11 mg per day.  28-40 years old: 8 mg per day.  Women.   60-76 years old: 15 mg per day.  68-67 years old: 18 mg per day.  Over 45 years old: 8 mg per day.  Pregnant women: 27 mg per day.  Breastfeeding women: 9 mg per day. WHAT DO I NEED TO KNOW ABOUT AN IRON-RICH DIET?  Eat fresh fruits and vegetables that are high in vitamin C along with foods that are high in iron. This will help increase the amount of iron that your body absorbs from food, especially with foods containing nonheme iron. Foods that are high in vitamin C include oranges, peppers, tomatoes, and mango.  Take iron supplements only as directed by your health care provider. Overdose of iron can be life-threatening. If you were prescribed iron supplements, take them with orange juice or a vitamin C supplement.  Cook foods in pots and pans that are made from iron.   Eat nonheme iron-containing foods alongside foods that are high in heme iron. This helps to improve your iron absorption.   Certain foods and drinks contain compounds that impair iron absorption. Avoid eating  these foods in the same meal as iron-rich foods or with iron supplements. These include:  Coffee, black tea, and red wine.  Milk, dairy products, and foods that are high in calcium.  Beans, soybeans, and peas.  Whole grains.  When eating foods that contain both nonheme iron and compounds that impair iron absorption, follow these tips to absorb iron better.   Soak beans overnight before cooking.  Soak whole grains overnight and drain them before using.  Ferment flours before baking, such as using yeast in bread dough. WHAT FOODS CAN I EAT? Grains Iron-fortified breakfast cereal. Iron-fortified whole-wheat bread. Enriched rice. Sprouted grains. Vegetables Spinach. Potatoes with skin. Green peas. Broccoli. Red and green bell peppers. Fermented vegetables. Fruits Prunes. Raisins. Oranges. Strawberries. Mango. Grapefruit. Meats and Other Protein Sources Beef liver. Oysters. Beef. Shrimp. [place]. Chicken. [place]. Sardines. Chickpeas. Nuts. Tofu. Beverages Tomato juice. Fresh orange juice. Prune juice. Hibiscus tea. Fortified instant breakfast shakes. Condiments Tahini. Fermented soy sauce. Sweets and Desserts Black-strap molasses.  Other Wheat germ. The items listed above may not be a complete list of recommended foods or beverages. Contact your dietitian for more options. WHAT FOODS ARE NOT RECOMMENDED? Grains Whole grains. Bran cereal. Bran flour. Oats. Vegetables Artichokes. Brussels sprouts. Kale. Fruits Blueberries. Raspberries. Strawberries. Figs. Meats and Other Protein Sources Soybeans. Products made from soy protein. Dairy Milk. Cream. Cheese. Yogurt. Cottage cheese. Beverages Coffee. Black tea. Red wine. Sweets and Desserts Cocoa. Chocolate. Ice cream. Other Basil. Oregano. Parsley. The items listed above may not be a complete list of foods and beverages to avoid. Contact your dietitian for more information.   This information is not  intended to replace advice given to you by your health care provider. Make sure you discuss any questions you have with your health care provider.   Document Released: 10/23/2004 Document Revised: 04/01/2014 Document Reviewed: 10/06/2013 Elsevier Interactive Patient Education [employer].

## 2016-01-24 NOTE — Progress Notes (Signed)
Low-risk OB appointment G2P1001 972w6d Estimated Date of Delivery: 03/28/16 BP (!) 110/52   Pulse 80   Wt 191 lb (86.6 kg)   LMP 06/22/2015   BMI 34.93 kg/m   BP, weight, and urine reviewed.  Refer to obstetrical flow sheet for FH & FHR.  Reports good fm.  Denies regular uc's, lof, vb, or uti s/s. Leg cramps, hard time sleeping, shortness of breath.  No distress, HRRR, LCTAB, O2sat 98%RA, fingerstick Hgb 9.4 Rx fe bid, increase fe-rich foods Reviewed ptl s/s, fkc. Plan:  Continue routine obstetrical care  F/U in 2wks for OB appointment

## 2016-02-07 ENCOUNTER — Encounter: Payer: Self-pay | Admitting: Advanced Practice Midwife

## 2016-02-07 ENCOUNTER — Ambulatory Visit (INDEPENDENT_AMBULATORY_CARE_PROVIDER_SITE_OTHER): Payer: BLUE CROSS/BLUE SHIELD | Admitting: Advanced Practice Midwife

## 2016-02-07 VITALS — BP 100/60 | HR 76 | Wt 192.4 lb

## 2016-02-07 DIAGNOSIS — Z3482 Encounter for supervision of other normal pregnancy, second trimester: Secondary | ICD-10-CM

## 2016-02-07 DIAGNOSIS — Z331 Pregnant state, incidental: Secondary | ICD-10-CM

## 2016-02-07 DIAGNOSIS — Z3A33 33 weeks gestation of pregnancy: Secondary | ICD-10-CM

## 2016-02-07 DIAGNOSIS — Z3483 Encounter for supervision of other normal pregnancy, third trimester: Secondary | ICD-10-CM

## 2016-02-07 DIAGNOSIS — Z1389 Encounter for screening for other disorder: Secondary | ICD-10-CM

## 2016-02-07 LAB — POCT URINALYSIS DIPSTICK
GLUCOSE UA: NEGATIVE
Ketones, UA: NEGATIVE
NITRITE UA: NEGATIVE
RBC UA: NEGATIVE

## 2016-02-07 NOTE — Progress Notes (Signed)
G2P1001 3479w6d Estimated Date of Delivery: 03/28/16  Blood pressure 100/60, pulse 76, weight 192 lb 6.4 oz (87.3 kg), last menstrual period 06/22/2015.   BP weight and urine results all reviewed and noted.  Please refer to the obstetrical flow sheet for the fundal height and fetal heart rate documentation:  Patient reports good fetal movement, denies any bleeding and no rupture of membranes symptoms or regular contractions. Patient is without complaints. All questions were answered.  Orders Placed This Encounter  Procedures  . POCT urinalysis dipstick    Plan:  Continued routine obstetrical care,   Return in about 2 weeks (around 02/21/2016) for LROB.

## 2016-02-07 NOTE — Patient Instructions (Signed)
Third Trimester of Pregnancy The third trimester is from week 29 through week 40 (months 7 through 9). The third trimester is a time when the unborn baby (fetus) is growing rapidly. At the end of the ninth month, the fetus is about 20 inches in length and weighs 6-10 pounds. Body changes during your third trimester Your body goes through many changes during pregnancy. The changes vary from woman to woman. During the third trimester:  Your weight will continue to increase. You can expect to gain 25-35 pounds (11-16 kg) by the end of the pregnancy.  You may begin to get stretch marks on your hips, abdomen, and breasts.  You may urinate more often because the fetus is moving lower into your pelvis and pressing on your bladder.  You may develop or continue to have heartburn. This is caused by increased hormones that slow down muscles in the digestive tract.  You may develop or continue to have constipation because increased hormones slow digestion and cause the muscles that push waste through your intestines to relax.  You may develop hemorrhoids. These are swollen veins (varicose veins) in the rectum that can itch or be painful.  You may develop swollen, bulging veins (varicose veins) in your legs.  You may have increased body aches in the pelvis, back, or thighs. This is due to weight gain and increased hormones that are relaxing your joints.  You may have changes in your hair. These can include thickening of your hair, rapid growth, and changes in texture. Some women also have hair loss during or after pregnancy, or hair that feels dry or thin. Your hair will most likely return to normal after your baby is born.  Your breasts will continue to grow and they will continue to become tender. A yellow fluid (colostrum) may leak from your breasts. This is the first milk you are producing for your baby.  Your belly button may stick out.  You may notice more swelling in your hands, face, or  ankles.  You may have increased tingling or numbness in your hands, arms, and legs. The skin on your belly may also feel numb.  You may feel short of breath because of your expanding uterus.  You may have more problems sleeping. This can be caused by the size of your belly, increased need to urinate, and an increase in your body's metabolism.  You may notice the fetus "dropping," or moving lower in your abdomen.  You may have increased vaginal discharge.  Your cervix becomes thin and soft (effaced) near your due date. What to expect at prenatal visits You will have prenatal exams every 2 weeks until week 36. Then you will have weekly prenatal exams. During a routine prenatal visit:  You will be weighed to make sure you and the fetus are growing normally.  Your blood pressure will be taken.  Your abdomen will be measured to track your baby's growth.  The fetal heartbeat will be listened to.  Any test results from the previous visit will be discussed.  You may have a cervical check near your due date to see if you have effaced. At around 36 weeks, your health care provider will check your cervix. At the same time, your health care provider will also perform a test on the secretions of the vaginal tissue. This test is to determine if a type of bacteria, Group B streptococcus, is present. Your health care provider will explain this further. Your health care provider may ask you:    What your birth plan is.  How you are feeling.  If you are feeling the baby move.  If you have had any abnormal symptoms, such as leaking fluid, bleeding, severe headaches, or abdominal cramping.  If you are using any tobacco products, including cigarettes, chewing tobacco, and electronic cigarettes.  If you have any questions. Other tests or screenings that may be performed during your third trimester include:  Blood tests that check for low iron levels (anemia).  Fetal testing to check the health,  activity level, and growth of the fetus. Testing is done if you have certain medical conditions or if there are problems during the pregnancy.  Nonstress test (NST). This test checks the health of your baby to make sure there are no signs of problems, such as the baby not getting enough oxygen. During this test, a belt is placed around your belly. The baby is made to move, and its heart rate is monitored during movement. What is false labor? False labor is a condition in which you feel small, irregular tightenings of the muscles in the womb (contractions) that eventually go away. These are called Braxton Hicks contractions. Contractions may last for hours, days, or even weeks before true labor sets in. If contractions come at regular intervals, become more frequent, increase in intensity, or become painful, you should see your health care provider. What are the signs of labor?  Abdominal cramps.  Regular contractions that start at 10 minutes apart and become stronger and more frequent with time.  Contractions that start on the top of the uterus and spread down to the lower abdomen and back.  Increased pelvic pressure and dull back pain.  A watery or bloody mucus discharge that comes from the vagina.  Leaking of amniotic fluid. This is also known as your "water breaking." It could be a slow trickle or a gush. Let your doctor know if it has a color or strange odor. If you have any of these signs, call your health care provider right away, even if it is before your due date. Follow these instructions at home: Eating and drinking  Continue to eat regular, healthy meals.  Do not eat:  Raw meat or meat spreads.  Unpasteurized milk or cheese.  Unpasteurized juice.  Store-made salad.  Refrigerated smoked seafood.  Hot dogs or deli meat, unless they are piping hot.  More than 6 ounces of albacore tuna a week.  Shark, swordfish, king mackerel, or tile fish.  Store-made salads.  Raw  sprouts, such as mung bean or alfalfa sprouts.  Take prenatal vitamins as told by your health care provider.  Take 1000 mg of calcium daily as told by your health care provider.  If you develop constipation:  Take over-the-counter or prescription medicines.  Drink enough fluid to keep your urine clear or pale yellow.  Eat foods that are high in fiber, such as fresh fruits and vegetables, whole grains, and beans.  Limit foods that are high in fat and processed sugars, such as fried and sweet foods. Activity  Exercise only as directed by your health care provider. Healthy pregnant women should aim for 2 hours and 30 minutes of moderate exercise per week. If you experience any pain or discomfort while exercising, stop.  Avoid heavy lifting.  Do not exercise in extreme heat or humidity, or at high altitudes.  Wear low-heel, comfortable shoes.  Practice good posture.  Do not travel far distances unless it is absolutely necessary and only with the approval   of your health care provider.  Wear your seat belt at all times while in a car, on a bus, or on a plane.  Take frequent breaks and rest with your legs elevated if you have leg cramps or low back pain.  Do not use hot tubs, steam rooms, or saunas.  You may continue to have sex unless your health care provider tells you otherwise. Lifestyle  Do not use any products that contain nicotine or tobacco, such as cigarettes and e-cigarettes. If you need help quitting, ask your health care provider.  Do not drink alcohol.  Do not use any medicinal herbs or unprescribed drugs. These chemicals affect the formation and growth of the baby.  If you develop varicose veins:  Wear support pantyhose or compression stockings as told by your healthcare provider.  Elevate your feet for 15 minutes, 3-4 times a day.  Wear a supportive maternity bra to help with breast tenderness. General instructions  Take over-the-counter and prescription  medicines only as told by your health care provider. There are medicines that are either safe or unsafe to take during pregnancy.  Take warm sitz baths to soothe any pain or discomfort caused by hemorrhoids. Use hemorrhoid cream or witch hazel if your health care provider approves.  Avoid cat litter boxes and soil used by cats. These carry germs that can cause birth defects in the baby. If you have a cat, ask someone to clean the litter box for you.  To prepare for the arrival of your baby:  Take prenatal classes to understand, practice, and ask questions about the labor and delivery.  Make a trial run to the hospital.  Visit the hospital and tour the maternity area.  Arrange for maternity or paternity leave through employers.  Arrange for family and friends to take care of pets while you are in the hospital.  Purchase a rear-facing car seat and make sure you know how to install it in your car.  Pack your hospital bag.  Prepare the baby's nursery. Make sure to remove all pillows and stuffed animals from the baby's crib to prevent suffocation.  Visit your dentist if you have not gone during your pregnancy. Use a soft toothbrush to brush your teeth and be gentle when you floss.  Keep all prenatal follow-up visits as told by your health care provider. This is important. Contact a health care provider if:  You are unsure if you are in labor or if your water has broken.  You become dizzy.  You have mild pelvic cramps, pelvic pressure, or nagging pain in your abdominal area.  You have lower back pain.  You have persistent nausea, vomiting, or diarrhea.  You have an unusual or bad smelling vaginal discharge.  You have pain when you urinate. Get help right away if:  You have a fever.  You are leaking fluid from your vagina.  You have spotting or bleeding from your vagina.  You have severe abdominal pain or cramping.  You have rapid weight loss or weight gain.  You have  shortness of breath with chest pain.  You notice sudden or extreme swelling of your face, hands, ankles, feet, or legs.  Your baby makes fewer than 10 movements in 2 hours.  You have severe headaches that do not go away with medicine.  You have vision changes. Summary  The third trimester is from week 29 through week 40, months 7 through 9. The third trimester is a time when the unborn baby (fetus)   is growing rapidly.  During the third trimester, your discomfort may increase as you and your baby continue to gain weight. You may have abdominal, leg, and back pain, sleeping problems, and an increased need to urinate.  During the third trimester your breasts will keep growing and they will continue to become tender. A yellow fluid (colostrum) may leak from your breasts. This is the first milk you are producing for your baby.  False labor is a condition in which you feel small, irregular tightenings of the muscles in the womb (contractions) that eventually go away. These are called Braxton Hicks contractions. Contractions may last for hours, days, or even weeks before true labor sets in.  Signs of labor can include: abdominal cramps; regular contractions that start at 10 minutes apart and become stronger and more frequent with time; watery or bloody mucus discharge that comes from the vagina; increased pelvic pressure and dull back pain; and leaking of amniotic fluid. This information is not intended to replace advice given to you by your health care provider. Make sure you discuss any questions you have with your health care provider. Document Released: 03/05/2001 Document Revised: 08/17/2015 Document Reviewed: 05/12/2012 Elsevier Interactive Patient Education  2017 [employer] Inc.  

## 2016-02-21 ENCOUNTER — Encounter: Payer: Self-pay | Admitting: Women's Health

## 2016-02-21 ENCOUNTER — Ambulatory Visit (INDEPENDENT_AMBULATORY_CARE_PROVIDER_SITE_OTHER): Payer: BLUE CROSS/BLUE SHIELD | Admitting: Women's Health

## 2016-02-21 VITALS — BP 106/60 | HR 68 | Wt 198.0 lb

## 2016-02-21 DIAGNOSIS — Z3483 Encounter for supervision of other normal pregnancy, third trimester: Secondary | ICD-10-CM

## 2016-02-21 DIAGNOSIS — Z1389 Encounter for screening for other disorder: Secondary | ICD-10-CM

## 2016-02-21 DIAGNOSIS — Z3A35 35 weeks gestation of pregnancy: Secondary | ICD-10-CM

## 2016-02-21 DIAGNOSIS — Z331 Pregnant state, incidental: Secondary | ICD-10-CM

## 2016-02-21 LAB — POCT URINALYSIS DIPSTICK
Blood, UA: NEGATIVE
GLUCOSE UA: NEGATIVE
Ketones, UA: NEGATIVE
Nitrite, UA: NEGATIVE
Protein, UA: NEGATIVE

## 2016-02-21 NOTE — Patient Instructions (Signed)
Call the office ([phone]) or go to [facility] if:  You begin to have strong, frequent contractions  Your water breaks.  Sometimes it is a big gush of fluid, sometimes it is just a trickle that keeps getting your panties wet or running down your legs  You have vaginal bleeding.  It is normal to have a small amount of spotting if your cervix was checked.   You don't feel your baby moving like normal.  If you don't, get you something to eat and drink and lay down and focus on feeling your baby move.  You should feel at least 10 movements in 2 hours.  If you don't, you should call the office or go to [facility].     Preterm Labor and Birth Information The normal length of a pregnancy is 39-41 weeks. Preterm labor is when labor starts before 37 completed weeks of pregnancy. What are the risk factors for preterm labor? Preterm labor is more likely to occur in women who:  Have certain infections during pregnancy such as a bladder infection, sexually transmitted infection, or infection inside the uterus (chorioamnionitis).  Have a shorter-than-normal cervix.  Have gone into preterm labor before.  Have had surgery on their cervix.  Are younger than age 17 or older than age 35.  Are African American.  Are pregnant with twins or multiple babies (multiple gestation).  Take street drugs or smoke while pregnant.  Do not gain enough weight while pregnant.  Became pregnant shortly after having been pregnant. What are the symptoms of preterm labor? Symptoms of preterm labor include:  Cramps similar to those that can happen during a menstrual period. The cramps may happen with diarrhea.  Pain in the abdomen or lower back.  Regular uterine contractions that may feel like tightening of the abdomen.  A feeling of increased pressure in the pelvis.  Increased watery or bloody mucus discharge from the vagina.  Water breaking (ruptured amniotic sac). Why is it important to  recognize signs of preterm labor? It is important to recognize signs of preterm labor because babies who are born prematurely may not be fully developed. This can put them at an increased risk for:  Long-term (chronic) heart and lung problems.  Difficulty immediately after birth with regulating body systems, including blood sugar, body temperature, heart rate, and breathing rate.  Bleeding in the brain.  Cerebral palsy.  Learning difficulties.  Death. These risks are highest for babies who are born before 34 weeks of pregnancy. How is preterm labor treated? Treatment depends on the length of your pregnancy, your condition, and the health of your baby. It may involve:  Having a stitch (suture) placed in your cervix to prevent your cervix from opening too early (cerclage).  Taking or being given medicines, such as:  Hormone medicines. These may be given early in pregnancy to help support the pregnancy.  Medicine to stop contractions.  Medicines to help mature the baby's lungs. These may be prescribed if the risk of delivery is high.  Medicines to prevent your baby from developing cerebral palsy. If the labor happens before 34 weeks of pregnancy, you may need to stay in the hospital. What should I do if I think I am in preterm labor? If you think that you are going into preterm labor, call your health care provider right away. How can I prevent preterm labor in future pregnancies? To increase your chance of having a full-term pregnancy:  Do not use any tobacco products, such   as cigarettes, chewing tobacco, and e-cigarettes. If you need help quitting, ask your health care provider.  Do not use street drugs or medicines that have not been prescribed to you during your pregnancy.  Talk with your health care provider before taking any herbal supplements, even if you have been taking them regularly.  Make sure you gain a healthy amount of weight during your pregnancy.  Watch for  infection. If you think that you might have an infection, get it checked right away.  Make sure to tell your health care provider if you have gone into preterm labor before. This information is not intended to replace advice given to you by your health care provider. Make sure you discuss any questions you have with your health care provider. Document Released: 06/01/2003 Document Revised: 08/22/2015 Document Reviewed: 08/02/2015 Elsevier Interactive Patient Education  2017 [employer] Inc.  

## 2016-02-21 NOTE — Progress Notes (Signed)
Low-risk OB appointment G2P1001 6390w6d Estimated Date of Delivery: 03/28/16 BP 106/60   Pulse 68   Wt 198 lb (89.8 kg)   LMP 06/22/2015   BMI 36.21 kg/m   BP, weight, and urine reviewed.  Refer to obstetrical flow sheet for FH & FHR.  Reports good fm.  Denies regular uc's, lof, vb, or uti s/s. No complaints. Reviewed ptl s/s, fkc. Plan:  Continue routine obstetrical care  F/U in 2wks for OB appointment and gbs

## 2016-03-06 ENCOUNTER — Ambulatory Visit (INDEPENDENT_AMBULATORY_CARE_PROVIDER_SITE_OTHER): Payer: BLUE CROSS/BLUE SHIELD | Admitting: Advanced Practice Midwife

## 2016-03-06 VITALS — BP 110/70 | HR 80 | Wt 201.0 lb

## 2016-03-06 DIAGNOSIS — Z3A37 37 weeks gestation of pregnancy: Secondary | ICD-10-CM

## 2016-03-06 DIAGNOSIS — Z331 Pregnant state, incidental: Secondary | ICD-10-CM

## 2016-03-06 DIAGNOSIS — Z3483 Encounter for supervision of other normal pregnancy, third trimester: Secondary | ICD-10-CM

## 2016-03-06 DIAGNOSIS — Z1389 Encounter for screening for other disorder: Secondary | ICD-10-CM

## 2016-03-06 DIAGNOSIS — Z3685 Encounter for antenatal screening for Streptococcus B: Secondary | ICD-10-CM

## 2016-03-06 LAB — POCT URINALYSIS DIPSTICK
Blood, UA: NEGATIVE
GLUCOSE UA: NEGATIVE
Glucose, UA: NEGATIVE
Ketones, UA: NEGATIVE
NITRITE UA: NEGATIVE
PROTEIN UA: NEGATIVE

## 2016-03-06 LAB — OB RESULTS CONSOLE GBS: GBS: NEGATIVE

## 2016-03-06 NOTE — Progress Notes (Signed)
G2P1001 6463w6d Estimated Date of Delivery: 03/28/16  Blood pressure 110/70, pulse 80, weight 201 lb (91.2 kg), last menstrual period 06/22/2015.   BP weight and urine results all reviewed and noted.  Please refer to the obstetrical flow sheet for the fundal height and fetal heart rate documentation:  Patient reports good fetal movement, denies any bleeding and no rupture of membranes symptoms or regular contractions. Patient is without complaints. All questions were answered.  Orders Placed This Encounter  Procedures  . GC/Chlamydia Probe Amp  . Strep Gp B NAA  . POCT urinalysis dipstick    Plan:  Continued routine obstetrical care,   Return in about 1 week (around 03/13/2016) for LROB.

## 2016-03-06 NOTE — Patient Instructions (Signed)

## 2016-03-08 LAB — GC/CHLAMYDIA PROBE AMP
Chlamydia trachomatis, NAA: NEGATIVE
Neisseria gonorrhoeae by PCR: NEGATIVE

## 2016-03-08 LAB — STREP GP B NAA: Strep Gp B NAA: NEGATIVE

## 2016-03-13 ENCOUNTER — Ambulatory Visit (INDEPENDENT_AMBULATORY_CARE_PROVIDER_SITE_OTHER): Payer: BLUE CROSS/BLUE SHIELD | Admitting: Obstetrics and Gynecology

## 2016-03-13 ENCOUNTER — Encounter: Payer: Self-pay | Admitting: Obstetrics and Gynecology

## 2016-03-13 VITALS — BP 128/60 | HR 80 | Wt 202.4 lb

## 2016-03-13 DIAGNOSIS — R319 Hematuria, unspecified: Secondary | ICD-10-CM

## 2016-03-13 DIAGNOSIS — O2343 Unspecified infection of urinary tract in pregnancy, third trimester: Secondary | ICD-10-CM

## 2016-03-13 DIAGNOSIS — Z3403 Encounter for supervision of normal first pregnancy, third trimester: Secondary | ICD-10-CM

## 2016-03-13 DIAGNOSIS — Z331 Pregnant state, incidental: Secondary | ICD-10-CM

## 2016-03-13 DIAGNOSIS — Z3A38 38 weeks gestation of pregnancy: Secondary | ICD-10-CM

## 2016-03-13 DIAGNOSIS — Z1389 Encounter for screening for other disorder: Secondary | ICD-10-CM

## 2016-03-13 DIAGNOSIS — Z3483 Encounter for supervision of other normal pregnancy, third trimester: Secondary | ICD-10-CM

## 2016-03-13 LAB — POCT URINALYSIS DIPSTICK
Glucose, UA: NEGATIVE
Ketones, UA: NEGATIVE
Nitrite, UA: NEGATIVE
PROTEIN UA: NEGATIVE

## 2016-03-13 MED ORDER — NITROFURANTOIN MONOHYD MACRO 100 MG PO CAPS: 100.0000 mg | ORAL_CAPSULE | Freq: Two times a day (BID) | ORAL | 0 refills | Status: DC

## 2016-03-13 NOTE — Progress Notes (Signed)
G2P1001  Estimated Date of Delivery: 03/28/16 LROB 3924w6d  Blood pressure 128/60, pulse 80, weight 202 lb 6.4 oz (91.8 kg), last menstrual period 06/22/2015.   Urine results:notable for  2+ blood and 2+ leukocytes   Chief Complaint  Patient presents with  . Routine Prenatal Visit    Patient complaints: none at this time. Patient reports   good fetal movement,                           denies any bleeding , rupture of membranes,or regular contractions.   refer to the ob flow sheet for FH and FHR, ,                          Physical Examination: General appearance - alert, well appearing, and in no distress                                      Abdomen - FH 38 ,                                                         -FHR 131                                                         soft, nontender, nondistended, no masses or organomegaly                                      Pelvic :  -3, tight finger tip                                            Questions were answered. Assessment: UTI, LROB G2P1001 @ 6924w6d , Estimated Date of Delivery: 03/28/16    Plan:  Call patient and start on Macrodantin 100 mg BID x7 days.  Continued routine obstetrical care,   F/u in 2 weeks for LROB   By signing my name below, I, Sonum Patel, attest that this documentation has been prepared under the direction and in the presence of Christin BachJohn Clayvon Parlett, MD. Electronically Signed: Sonum Patel, Neurosurgeoncribe. 03/13/16. 4:01 PM.  I personally performed the services described in this documentation, which was SCRIBED in my presence. The recorded information has been reviewed and considered accurate. It has been edited as necessary during review. Tilda BurrowFERGUSON,Leldon Steege V, MD

## 2016-03-13 NOTE — Addendum Note (Signed)
Addended by: Gaylyn RongEVANS, Hawley Michel A on: 03/13/2016 04:33 PM   Modules accepted: Orders

## 2016-03-15 ENCOUNTER — Inpatient Hospital Stay (HOSPITAL_COMMUNITY)
Admission: AD | Admit: 2016-03-15 | Discharge: 2016-03-15 | Disposition: A | Discharge status: Home / Self Care | Payer: BLUE CROSS/BLUE SHIELD | Source: Ambulatory Visit | Attending: Obstetrics and Gynecology | Admitting: Obstetrics and Gynecology

## 2016-03-15 ENCOUNTER — Telehealth: Payer: Self-pay | Admitting: *Deleted

## 2016-03-15 ENCOUNTER — Encounter (HOSPITAL_COMMUNITY): Payer: Self-pay | Admitting: *Deleted

## 2016-03-15 DIAGNOSIS — Z3493 Encounter for supervision of normal pregnancy, unspecified, third trimester: Secondary | ICD-10-CM | POA: Diagnosis not present

## 2016-03-15 LAB — AMNISURE RUPTURE OF MEMBRANE (ROM) NOT AT ARMC: Amnisure ROM: NEGATIVE

## 2016-03-15 LAB — URINE CULTURE

## 2016-03-15 NOTE — Telephone Encounter (Signed)
Pt called with complaints of leaking fluid. + baby movement. No contractions yet. No provider was in office so pt was advised to [facility]. Pt voiced understanding. JSY

## 2016-03-15 NOTE — MAU Note (Signed)
Urine in lab 

## 2016-03-15 NOTE — MAU Note (Signed)
Gush of fluid noted 0700, woke her.  Unsure if ROM, or urine.  Clear fluids, drops since.  No bleeding.  Little crampy, has been alst few days.

## 2016-03-15 NOTE — Discharge Instructions (Signed)
Braxton Hicks Contractions °Contractions of the uterus can occur throughout pregnancy. Contractions are not always a sign that you are in labor.  °WHAT ARE BRAXTON HICKS CONTRACTIONS?  °Contractions that occur before labor are called Braxton Hicks contractions, or false labor. Toward the end of pregnancy (32-34 weeks), these contractions can develop more often and may become more forceful. This is not true labor because these contractions do not result in opening (dilatation) and thinning of the cervix. They are sometimes difficult to tell apart from true labor because these contractions can be forceful and people have different pain tolerances. You should not feel embarrassed if you go to the hospital with false labor. Sometimes, the only way to tell if you are in true labor is for your health care provider to look for changes in the cervix. °If there are no prenatal problems or other health problems associated with the pregnancy, it is completely safe to be sent home with false labor and await the onset of true labor. °HOW CAN YOU TELL THE DIFFERENCE BETWEEN TRUE AND FALSE LABOR? °False Labor  °· The contractions of false labor are usually shorter and not as hard as those of true labor.   °· The contractions are usually irregular.   °· The contractions are often felt in the front of the lower abdomen and in the groin.   °· The contractions may go away when you walk around or change positions while lying down.   °· The contractions get weaker and are shorter lasting as time goes on.   °· The contractions do not usually become progressively stronger, regular, and closer together as with true labor.   °True Labor  °· Contractions in true labor last 30-70 seconds, become very regular, usually become more intense, and increase in frequency.   °· The contractions do not go away with walking.   °· The discomfort is usually felt in the top of the uterus and spreads to the lower abdomen and low back.   °· True labor can be  determined by your health care provider with an exam. This will show that the cervix is dilating and getting thinner.   °WHAT TO REMEMBER °· Keep up with your usual exercises and follow other instructions given by your health care provider.   °· Take medicines as directed by your health care provider.   °· Keep your regular prenatal appointments.   °· Eat and drink lightly if you think you are going into labor.   °· If Braxton Hicks contractions are making you uncomfortable:   °¨ Change your position from lying down or resting to walking, or from walking to resting.   °¨ Sit and rest in a tub of warm water.   °¨ Drink 2-3 glasses of water. Dehydration may cause these contractions.   °¨ Do slow and deep breathing several times an hour.   °WHEN SHOULD I SEEK IMMEDIATE MEDICAL CARE? °Seek immediate medical care if: °· Your contractions become stronger, more regular, and closer together.   °· You have fluid leaking or gushing from your vagina.   °· You have a fever.     °· You have vaginal bleeding.   °· You have continuous abdominal pain.   °· You have low back pain that you never had before.   °· You feel your baby's head pushing down and causing pelvic pressure.   °· Your baby is not moving as much as it used to.   °This information is not intended to replace advice given to you by your health care provider. Make sure you discuss any questions you have with your health care provider. °Document Released: 03/11/2005 Document   Revised: 07/03/2015 Document Reviewed: 12/21/2012 °Elsevier Interactive Patient Education © 2017 [employer]vier Inc. ° °

## 2016-03-15 NOTE — MAU Note (Signed)
Pad on , is dry

## 2016-03-25 NOTE — L&D Delivery Note (Signed)
Delivery Note At 5:34 PM a viable female was delivered via Vaginal, Spontaneous Delivery (Presentation:vertex ;LOA  ).  APGAR: 9, 9; weight  .   Placenta status:spont ,shultz .  Cord:3vc  with the following complications:none .  Cord pH: n/a  Anesthesia:  epidural Episiotomy: None Lacerations: 2nd degree;Perineal Suture Repair: 2.0 vicryl Est. Blood Loss (mL): 100  Mom to postpartum.  Baby to Couplet care / Skin to Skin.  Anne Marshall 04/07/2016, 6:05 PM

## 2016-03-27 ENCOUNTER — Encounter: Payer: Self-pay | Admitting: Advanced Practice Midwife

## 2016-03-27 ENCOUNTER — Ambulatory Visit (INDEPENDENT_AMBULATORY_CARE_PROVIDER_SITE_OTHER): Payer: BLUE CROSS/BLUE SHIELD | Admitting: Advanced Practice Midwife

## 2016-03-27 VITALS — BP 113/63 | HR 76 | Wt 207.0 lb

## 2016-03-27 DIAGNOSIS — Z3483 Encounter for supervision of other normal pregnancy, third trimester: Secondary | ICD-10-CM

## 2016-03-27 DIAGNOSIS — Z331 Pregnant state, incidental: Secondary | ICD-10-CM

## 2016-03-27 DIAGNOSIS — Z1389 Encounter for screening for other disorder: Secondary | ICD-10-CM

## 2016-03-27 DIAGNOSIS — Z3A40 40 weeks gestation of pregnancy: Secondary | ICD-10-CM

## 2016-03-27 LAB — POCT URINALYSIS DIPSTICK
Glucose, UA: NEGATIVE
Ketones, UA: NEGATIVE
Nitrite, UA: NEGATIVE
PROTEIN UA: NEGATIVE
RBC UA: NEGATIVE

## 2016-03-27 NOTE — Progress Notes (Signed)
G2P1001 6355w6d Estimated Date of Delivery: 03/28/16  Blood pressure 113/63, pulse 76, weight 207 lb (93.9 kg), last menstrual period 06/22/2015.   BP weight and urine results all reviewed and noted.  Please refer to the obstetrical flow sheet for the fundal height and fetal heart rate documentation:  Patient reports good fetal movement, denies any bleeding and no rupture of membranes symptoms or regular contractions. Patient is without complaints. All questions were answered.  Orders Placed This Encounter  Procedures  . POCT urinalysis dipstick    Plan:  Continued routine obstetrical care, note to start maternity leave (not disability) faxed.  Membranes stripped  Return in about 1 week (around 04/03/2016) for LROB.

## 2016-04-03 ENCOUNTER — Encounter: Payer: Self-pay | Admitting: Advanced Practice Midwife

## 2016-04-03 ENCOUNTER — Inpatient Hospital Stay (HOSPITAL_COMMUNITY)
Admission: AD | Admit: 2016-04-03 | Discharge: 2016-04-03 | Disposition: A | Discharge status: Home / Self Care | Payer: BLUE CROSS/BLUE SHIELD | Source: Ambulatory Visit | Attending: Obstetrics and Gynecology | Admitting: Obstetrics and Gynecology

## 2016-04-03 ENCOUNTER — Ambulatory Visit (INDEPENDENT_AMBULATORY_CARE_PROVIDER_SITE_OTHER): Payer: BLUE CROSS/BLUE SHIELD | Admitting: Advanced Practice Midwife

## 2016-04-03 ENCOUNTER — Encounter (HOSPITAL_COMMUNITY): Payer: Self-pay | Admitting: *Deleted

## 2016-04-03 VITALS — BP 110/70 | HR 76 | Wt 208.0 lb

## 2016-04-03 DIAGNOSIS — O480 Post-term pregnancy: Secondary | ICD-10-CM

## 2016-04-03 DIAGNOSIS — Z3493 Encounter for supervision of normal pregnancy, unspecified, third trimester: Secondary | ICD-10-CM | POA: Insufficient documentation

## 2016-04-03 DIAGNOSIS — Z3A41 41 weeks gestation of pregnancy: Secondary | ICD-10-CM

## 2016-04-03 DIAGNOSIS — Z1389 Encounter for screening for other disorder: Secondary | ICD-10-CM

## 2016-04-03 DIAGNOSIS — Z331 Pregnant state, incidental: Secondary | ICD-10-CM

## 2016-04-03 LAB — POCT URINALYSIS DIPSTICK
GLUCOSE UA: NEGATIVE
Ketones, UA: NEGATIVE
NITRITE UA: NEGATIVE
Protein, UA: NEGATIVE

## 2016-04-03 NOTE — Patient Instructions (Signed)
If you are still pregnant on Sunday, 1/14 at 8 am, [facility] ([address] [place], [place]) to start your induction!  Eat a light meal before you come.  Anne MustGood Luck!!

## 2016-04-03 NOTE — Discharge Instructions (Signed)
Braxton Hicks Contractions Contractions of the uterus can occur throughout pregnancy. Contractions are not always a sign that you are in labor.  WHAT ARE BRAXTON HICKS CONTRACTIONS?  Contractions that occur before labor are called Braxton Hicks contractions, or false labor. Toward the end of pregnancy (32-34 weeks), these contractions can develop more often and may become more forceful. This is not true labor because these contractions do not result in opening (dilatation) and thinning of the cervix. They are sometimes difficult to tell apart from true labor because these contractions can be forceful and people have different pain tolerances. You should not feel embarrassed if you go to the hospital with false labor. Sometimes, the only way to tell if you are in true labor is for your health care provider to look for changes in the cervix. If there are no prenatal problems or other health problems associated with the pregnancy, it is completely safe to be sent home with false labor and await the onset of true labor. HOW CAN YOU TELL THE DIFFERENCE BETWEEN TRUE AND FALSE LABOR? False Labor   The contractions of false labor are usually shorter and not as hard as those of true labor.   The contractions are usually irregular.   The contractions are often felt in the front of the lower abdomen and in the groin.   The contractions may go away when you walk around or change positions while lying down.   The contractions get weaker and are shorter lasting as time goes on.   The contractions do not usually become progressively stronger, regular, and closer together as with true labor.  True Labor   Contractions in true labor last 30-70 seconds, become very regular, usually become more intense, and increase in frequency.   The contractions do not go away with walking.   The discomfort is usually felt in the top of the uterus and spreads to the lower abdomen and low back.   True labor can be  determined by your health care provider with an exam. This will show that the cervix is dilating and getting thinner.  WHAT TO REMEMBER  Keep up with your usual exercises and follow other instructions given by your health care provider.   Take medicines as directed by your health care provider.   Keep your regular prenatal appointments.   Eat and drink lightly if you think you are going into labor.   If Braxton Hicks contractions are making you uncomfortable:   Change your position from lying down or resting to walking, or from walking to resting.   Sit and rest in a tub of warm water.   Drink 2-3 glasses of water. Dehydration may cause these contractions.   Do slow and deep breathing several times an hour.  WHEN SHOULD I SEEK IMMEDIATE MEDICAL CARE? Seek immediate medical care if:  Your contractions become stronger, more regular, and closer together.   You have fluid leaking or gushing from your vagina.   You have a fever.   You pass blood-tinged mucus.   You have vaginal bleeding.   You have continuous abdominal pain.   You have low back pain that you never had before.   You feel your baby's head pushing down and causing pelvic pressure.   Your baby is not moving as much as it used to.  This information is not intended to replace advice given to you by your health care provider. Make sure you discuss any questions you have with your health care   provider. Document Released: 03/11/2005 Document Revised: 07/03/2015 Document Reviewed: 12/21/2012 Elsevier Interactive Patient Education  2017 [employer] Introduction Patient Name: ________________________________________________ Patient Due Date: ____________________ What is a fetal movement count? A fetal movement count is the number of times that you feel your baby move during a certain amount of time. This may also be called a fetal kick count. A fetal movement count is recommended for every pregnant  woman. You may be asked to start counting fetal movements as early as week 28 of your pregnancy. Pay attention to when your baby is most active. You may notice your baby's sleep and wake cycles. You may also notice things that make your baby move more. You should do a fetal movement count:  When your baby is normally most active.  At the same time each day. A good time to count movements is while you are resting, after having something to eat and drink. How do I count fetal movements? 1. Find a quiet, comfortable area. Sit, or lie down on your side. 2. Write down the date, the start time and stop time, and the number of movements that you felt between those two times. Take this information with you to your health care visits. 3. For 2 hours, count kicks, flutters, swishes, rolls, and jabs. You should feel at least 10 movements during 2 hours. 4. You may stop counting after you have felt 10 movements. 5. If you do not feel 10 movements in 2 hours, have something to eat and drink. Then, keep resting and counting for 1 hour. If you feel at least 4 movements during that hour, you may stop counting. Contact a health care provider if:  You feel fewer than 4 movements in 2 hours.  Your baby is not moving like he or she usually does. Date: ____________ Start time: ____________ Stop time: ____________ Movements: ____________ Date: ____________ Start time: ____________ Stop time: ____________ Movements: ____________ Date: ____________ Start time: ____________ Stop time: ____________ Movements: ____________ Date: ____________ Start time: ____________ Stop time: ____________ Movements: ____________ Date: ____________ Start time: ____________ Stop time: ____________ Movements: ____________ Date: ____________ Start time: ____________ Stop time: ____________ Movements: ____________ Date: ____________ Start time: ____________ Stop time: ____________ Movements: ____________ Date: ____________ Start time:  ____________ Stop time: ____________ Movements: ____________ Date: ____________ Start time: ____________ Stop time: ____________ Movements: ____________ This information is not intended to replace advice given to you by your health care provider. Make sure you discuss any questions you have with your health care provider. Document Released: 04/10/2006 Document Revised: 11/08/2015 Document Reviewed: 04/20/2015 Elsevier Interactive Patient Education  2017 Elsevier Inc.  

## 2016-04-03 NOTE — MAU Note (Signed)
Pt reports she was seen in the office today and was 3-4 and they stripped her membranes. Reports she has "quite a bit of bleeding " and the contractions are getting stronger.

## 2016-04-03 NOTE — Progress Notes (Signed)
G2P1001 5690w6d Estimated Date of Delivery: 03/28/16  Blood pressure 110/70, pulse 76, weight 208 lb (94.3 kg), last menstrual period 06/22/2015.   BP weight and urine results all reviewed and noted.  Please refer to the obstetrical flow sheet for the fundal height and fetal heart rate documentation:  Patient reports good fetal movement, denies any bleeding and no rupture of membranes symptoms or regular contractions. Patient is without complaints. All questions were answered.  Orders Placed This Encounter  Procedures  . POCT urinalysis dipstick    Plan:  Continued routine obstetrical care, IOL for 1/14 at 0800 (first available!) membranes stripped again.   Return for 1-2 days for BPP only.

## 2016-04-04 ENCOUNTER — Ambulatory Visit (INDEPENDENT_AMBULATORY_CARE_PROVIDER_SITE_OTHER): Payer: BLUE CROSS/BLUE SHIELD

## 2016-04-04 DIAGNOSIS — O480 Post-term pregnancy: Secondary | ICD-10-CM

## 2016-04-04 DIAGNOSIS — Z3A41 41 weeks gestation of pregnancy: Secondary | ICD-10-CM | POA: Diagnosis not present

## 2016-04-04 NOTE — Progress Notes (Signed)
US 41 wks,cephalic,post pl gr 1,normal ov's bilat,afi 14 cm,BPP 8/8,fhr 130 bpm

## 2016-04-07 ENCOUNTER — Inpatient Hospital Stay (HOSPITAL_COMMUNITY): Payer: BLUE CROSS/BLUE SHIELD | Admitting: Anesthesiology

## 2016-04-07 ENCOUNTER — Encounter (HOSPITAL_COMMUNITY): Payer: Self-pay

## 2016-04-07 ENCOUNTER — Inpatient Hospital Stay (HOSPITAL_COMMUNITY)
Admission: RE | Admit: 2016-04-07 | Discharge: 2016-04-09 | DRG: 775 | Disposition: A | Discharge status: Home / Self Care | Payer: BLUE CROSS/BLUE SHIELD | Source: Ambulatory Visit | Attending: Obstetrics & Gynecology | Admitting: Obstetrics & Gynecology

## 2016-04-07 DIAGNOSIS — O480 Post-term pregnancy: Secondary | ICD-10-CM | POA: Diagnosis present

## 2016-04-07 DIAGNOSIS — Z87891 Personal history of nicotine dependence: Secondary | ICD-10-CM

## 2016-04-07 DIAGNOSIS — Z833 Family history of diabetes mellitus: Secondary | ICD-10-CM

## 2016-04-07 DIAGNOSIS — Z3A41 41 weeks gestation of pregnancy: Secondary | ICD-10-CM | POA: Diagnosis not present

## 2016-04-07 LAB — TYPE AND SCREEN
ABO/RH(D): A POS
ANTIBODY SCREEN: NEGATIVE

## 2016-04-07 LAB — CBC
HEMATOCRIT: 31.9 % — AB (ref 36.0–46.0)
Hemoglobin: 10.6 g/dL — ABNORMAL LOW (ref 12.0–15.0)
MCH: 28.6 pg (ref 26.0–34.0)
MCHC: 33.2 g/dL (ref 30.0–36.0)
MCV: 86 fL (ref 78.0–100.0)
PLATELETS: 192 10*3/uL (ref 150–400)
RBC: 3.71 MIL/uL — AB (ref 3.87–5.11)
RDW: 14.9 % (ref 11.5–15.5)
WBC: 10.8 10*3/uL — AB (ref 4.0–10.5)

## 2016-04-07 LAB — ABO/RH: ABO/RH(D): A POS

## 2016-04-07 LAB — RPR: RPR Ser Ql: NONREACTIVE

## 2016-04-07 MED ORDER — LACTATED RINGERS IV SOLN: 500.0000 mL | Freq: Once | INTRAVENOUS | Status: DC

## 2016-04-07 MED ORDER — SODIUM CHLORIDE 0.9% FLUSH: 3.0000 mL | INTRAVENOUS | Status: DC | PRN

## 2016-04-07 MED ORDER — SOD CITRATE-CITRIC ACID 500-334 MG/5ML PO SOLN: 30.0000 mL | ORAL | Status: DC | PRN

## 2016-04-07 MED ORDER — FENTANYL CITRATE (PF) 100 MCG/2ML IJ SOLN: 100.0000 ug | INTRAMUSCULAR | Status: DC | PRN

## 2016-04-07 MED ORDER — EPHEDRINE 5 MG/ML INJ: 10.0000 mg | INTRAVENOUS | Status: DC | PRN

## 2016-04-07 MED ORDER — OXYCODONE-ACETAMINOPHEN 5-325 MG PO TABS: 2.0000 | ORAL_TABLET | ORAL | Status: DC | PRN

## 2016-04-07 MED ORDER — ONDANSETRON HCL 4 MG/2ML IJ SOLN: 4.0000 mg | INTRAMUSCULAR | Status: DC | PRN

## 2016-04-07 MED ORDER — DIPHENHYDRAMINE HCL 50 MG/ML IJ SOLN: 12.5000 mg | INTRAMUSCULAR | Status: DC | PRN

## 2016-04-07 MED ORDER — ACETAMINOPHEN 325 MG PO TABS
650.0000 mg | ORAL_TABLET | ORAL | Status: DC | PRN
  Administered 2016-04-08 (×2): 650 mg via ORAL
  Filled 2016-04-07 (×2): qty 2

## 2016-04-07 MED ORDER — DIPHENHYDRAMINE HCL 25 MG PO CAPS: 25.0000 mg | ORAL_CAPSULE | Freq: Four times a day (QID) | ORAL | Status: DC | PRN

## 2016-04-07 MED ORDER — MEASLES, MUMPS & RUBELLA VAC ~~LOC~~ INJ
0.5000 mL | INJECTION | Freq: Once | SUBCUTANEOUS | Status: DC
  Filled 2016-04-07: qty 0.5

## 2016-04-07 MED ORDER — DIBUCAINE 1 % RE OINT: 1.0000 "application " | TOPICAL_OINTMENT | RECTAL | Status: DC | PRN

## 2016-04-07 MED ORDER — SODIUM CHLORIDE 0.9 % IV SOLN: 250.0000 mL | INTRAVENOUS | Status: DC | PRN

## 2016-04-07 MED ORDER — ZOLPIDEM TARTRATE 5 MG PO TABS: 5.0000 mg | ORAL_TABLET | Freq: Every evening | ORAL | Status: DC | PRN

## 2016-04-07 MED ORDER — WITCH HAZEL-GLYCERIN EX PADS: 1.0000 "application " | MEDICATED_PAD | CUTANEOUS | Status: DC | PRN

## 2016-04-07 MED ORDER — OXYTOCIN 40 UNITS IN LACTATED RINGERS INFUSION - SIMPLE MED
1.0000 m[IU]/min | INTRAVENOUS | Status: DC
  Administered 2016-04-07: 2 m[IU]/min via INTRAVENOUS
  Filled 2016-04-07: qty 1000

## 2016-04-07 MED ORDER — LIDOCAINE HCL (PF) 1 % IJ SOLN
INTRAMUSCULAR | Status: AC
  Filled 2016-04-07: qty 30

## 2016-04-07 MED ORDER — ONDANSETRON HCL 4 MG PO TABS: 4.0000 mg | ORAL_TABLET | ORAL | Status: DC | PRN

## 2016-04-07 MED ORDER — TETANUS-DIPHTH-ACELL PERTUSSIS 5-2.5-18.5 LF-MCG/0.5 IM SUSP: 0.5000 mL | Freq: Once | INTRAMUSCULAR | Status: DC

## 2016-04-07 MED ORDER — PHENYLEPHRINE 40 MCG/ML (10ML) SYRINGE FOR IV PUSH (FOR BLOOD PRESSURE SUPPORT): 80.0000 ug | PREFILLED_SYRINGE | INTRAVENOUS | Status: DC | PRN

## 2016-04-07 MED ORDER — OXYTOCIN BOLUS FROM INFUSION
500.0000 mL | Freq: Once | INTRAVENOUS | Status: DC
  Administered 2016-04-07: 500 mL via INTRAVENOUS

## 2016-04-07 MED ORDER — FENTANYL 2.5 MCG/ML BUPIVACAINE 1/10 % EPIDURAL INFUSION (WH - ANES)
14.0000 mL/h | INTRAMUSCULAR | Status: DC | PRN
  Administered 2016-04-07: 14 mL/h via EPIDURAL
  Filled 2016-04-07: qty 100

## 2016-04-07 MED ORDER — PRENATAL MULTIVITAMIN CH
1.0000 | ORAL_TABLET | Freq: Every day | ORAL | Status: DC
  Administered 2016-04-08: 1 via ORAL
  Filled 2016-04-07: qty 1

## 2016-04-07 MED ORDER — IBUPROFEN 600 MG PO TABS
600.0000 mg | ORAL_TABLET | Freq: Four times a day (QID) | ORAL | Status: DC
  Administered 2016-04-07 – 2016-04-09 (×7): 600 mg via ORAL
  Filled 2016-04-07 (×7): qty 1

## 2016-04-07 MED ORDER — PHENYLEPHRINE 40 MCG/ML (10ML) SYRINGE FOR IV PUSH (FOR BLOOD PRESSURE SUPPORT)
80.0000 ug | PREFILLED_SYRINGE | INTRAVENOUS | Status: DC | PRN
  Filled 2016-04-07: qty 10

## 2016-04-07 MED ORDER — LIDOCAINE HCL (PF) 1 % IJ SOLN
INTRAMUSCULAR | Status: DC | PRN
  Administered 2016-04-07: 3 mL
  Administered 2016-04-07: 5 mL
  Administered 2016-04-07: 2 mL

## 2016-04-07 MED ORDER — ONDANSETRON HCL 4 MG/2ML IJ SOLN: 4.0000 mg | Freq: Four times a day (QID) | INTRAMUSCULAR | Status: DC | PRN

## 2016-04-07 MED ORDER — TERBUTALINE SULFATE 1 MG/ML IJ SOLN: 0.2500 mg | Freq: Once | INTRAMUSCULAR | Status: DC | PRN

## 2016-04-07 MED ORDER — OXYCODONE-ACETAMINOPHEN 5-325 MG PO TABS: 1.0000 | ORAL_TABLET | ORAL | Status: DC | PRN

## 2016-04-07 MED ORDER — LACTATED RINGERS IV SOLN
500.0000 mL | INTRAVENOUS | Status: DC | PRN
  Administered 2016-04-07 (×2): 500 mL via INTRAVENOUS

## 2016-04-07 MED ORDER — LACTATED RINGERS IV SOLN: INTRAVENOUS | Status: DC

## 2016-04-07 MED ORDER — LIDOCAINE HCL (PF) 1 % IJ SOLN: 30.0000 mL | INTRAMUSCULAR | Status: DC | PRN

## 2016-04-07 MED ORDER — ACETAMINOPHEN 325 MG PO TABS: 650.0000 mg | ORAL_TABLET | ORAL | Status: DC | PRN

## 2016-04-07 MED ORDER — COCONUT OIL OIL: 1.0000 "application " | TOPICAL_OIL | Status: DC | PRN

## 2016-04-07 MED ORDER — FLEET ENEMA 7-19 GM/118ML RE ENEM: 1.0000 | ENEMA | RECTAL | Status: DC | PRN

## 2016-04-07 MED ORDER — OXYTOCIN 40 UNITS IN LACTATED RINGERS INFUSION - SIMPLE MED: 2.5000 [IU]/h | INTRAVENOUS | Status: DC

## 2016-04-07 MED ORDER — SIMETHICONE 80 MG PO CHEW: 80.0000 mg | CHEWABLE_TABLET | ORAL | Status: DC | PRN

## 2016-04-07 MED ORDER — BENZOCAINE-MENTHOL 20-0.5 % EX AERO
1.0000 "application " | INHALATION_SPRAY | CUTANEOUS | Status: DC | PRN
  Administered 2016-04-08: 1 via TOPICAL
  Filled 2016-04-07: qty 56

## 2016-04-07 MED ORDER — SODIUM CHLORIDE 0.9% FLUSH: 3.0000 mL | Freq: Two times a day (BID) | INTRAVENOUS | Status: DC

## 2016-04-07 MED ORDER — SENNOSIDES-DOCUSATE SODIUM 8.6-50 MG PO TABS
2.0000 | ORAL_TABLET | ORAL | Status: DC
  Administered 2016-04-08 – 2016-04-09 (×2): 2 via ORAL
  Filled 2016-04-07 (×2): qty 2

## 2016-04-07 NOTE — H&P (Signed)
Anne Marshall is a 31 y.o. female G2P1001 @ 41.3 wks presenting for IOL for post dates. GBS neg OB History    Gravida Para Term Preterm AB Living   2 1 1     1    SAB TAB Ectopic Multiple Live Births                 Past Medical History:  Diagnosis Date  . Kidney stones 2011  . Pregnant 08/01/2015   Past Surgical History:  Procedure Laterality Date  . NO PAST SURGERIES     Family History: family history includes Diabetes in her father. Social History:  reports that she quit smoking about 3 years ago. Her smoking use included Cigarettes. She has never used smokeless tobacco. She reports that she does not drink alcohol or use drugs.     Maternal Diabetes: No Genetic Screening: Normal Maternal Ultrasounds/Referrals: Normal Fetal Ultrasounds or other Referrals:  None Maternal Substance Abuse:  No Significant Maternal Medications:  None Significant Maternal Lab Results:  None Other Comments:  None  Review of Systems  Constitutional: Negative.   HENT: Negative.   Eyes: Negative.   Respiratory: Negative.   Cardiovascular: Negative.   Gastrointestinal: Negative.   Genitourinary: Negative.   Musculoskeletal: Negative.   Skin: Negative.   Neurological: Negative.   Endo/Heme/Allergies: Negative.   Psychiatric/Behavioral: Negative.    Maternal Medical History:  Reason for admission: IOL for post dates  Contractions: none  Fetal activity: Perceived fetal activity is normal.   Last perceived fetal movement was within the past hour.    Prenatal complications: no prenatal complications Prenatal Complications - Diabetes: none.    Dilation: 3.5 Effacement (%): 70 Station: -2 Exam by:: darlene Ayahna Solazzo cnm Temperature 98.2 F (36.8 C), temperature source Oral, resp. rate 16, height 5\' 3"  (1.6 m), weight 208 lb (94.3 kg), last menstrual period 06/22/2015. Maternal Exam:  Abdomen: Patient reports no abdominal tenderness. Fetal presentation: vertex  Introitus: Normal  vulva. Normal vagina.  Ferning test: not done.  Nitrazine test: not done.  Pelvis: adequate for delivery.   Cervix: Cervix evaluated by digital exam.     Fetal Exam Fetal Monitor Review: Mode: ultrasound.   Variability: moderate (6-25 bpm).   Pattern: accelerations present.    Fetal State Assessment: Category I - tracings are normal.     Physical Exam  Constitutional: She is oriented to person, place, and time. She appears well-developed and well-nourished.  HENT:  Head: Normocephalic.  Eyes: Pupils are equal, round, and reactive to light.  Neck: Normal range of motion.  Cardiovascular: Normal rate, regular rhythm, normal heart sounds and intact distal pulses.   Respiratory: Effort normal and breath sounds normal.  GI: Soft. Bowel sounds are normal.  Genitourinary: Vagina normal and uterus normal.  Musculoskeletal: Normal range of motion.  Neurological: She is alert and oriented to person, place, and time. She has normal reflexes.  Skin: Skin is warm and dry.  Psychiatric: She has a normal mood and affect. Her behavior is normal. Judgment and thought content normal.    Prenatal labs: ABO, Rh: A/Positive/-- (06/14 [id number]) Antibody: Negative (10/11 0847) Rubella: 1.08 (06/14 0939) RPR: Non Reactive (10/11 0847)  HBsAg: Negative (06/14 0939)  HIV: Non Reactive (10/11 0847)  GBS: Negative (12/13 1400)   Assessment/Plan: IOL for POST DATES SVE 3-4/70/-2, stable maternal fetal unit. Will start pit induction of labor   Wyvonnia DuskyMarie Caterin Tabares 04/07/2016, 9:11 AM

## 2016-04-07 NOTE — Anesthesia Preprocedure Evaluation (Signed)
Anesthesia Evaluation  Patient identified by MRN, date of birth, ID band Patient awake    Reviewed: Allergy & Precautions, NPO status , Patient's Chart, lab work & pertinent test results  Airway Mallampati: II       Dental   Pulmonary former smoker,    Pulmonary exam normal        Cardiovascular negative cardio ROS Normal cardiovascular exam     Neuro/Psych negative neurological ROS     GI/Hepatic negative GI ROS, Neg liver ROS,   Endo/Other  negative endocrine ROS  Renal/GU negative Renal ROS     Musculoskeletal   Abdominal   Peds  Hematology  (+) anemia ,   Anesthesia Other Findings   Reproductive/Obstetrics (+) Pregnancy                             Lab Results  Component Value Date   WBC 10.8 (H) 04/07/2016   HGB 10.6 (L) 04/07/2016   HCT 31.9 (L) 04/07/2016   MCV 86.0 04/07/2016   PLT 192 04/07/2016    Anesthesia Physical Anesthesia Plan  ASA: II  Anesthesia Plan: Epidural   Post-op Pain Management:    Induction:   Airway Management Planned: Natural Airway  Additional Equipment:   Intra-op Plan:   Post-operative Plan:   Informed Consent: I have reviewed the patients History and Physical, chart, labs and discussed the procedure including the risks, benefits and alternatives for the proposed anesthesia with the patient or authorized representative who has indicated his/her understanding and acceptance.     Plan Discussed with:   Anesthesia Plan Comments:         Anesthesia Quick Evaluation

## 2016-04-07 NOTE — Anesthesia Procedure Notes (Signed)
Epidural Patient location during procedure: OB Start time: 04/07/2016 2:21 PM End time: 04/07/2016 2:31 PM  Staffing Anesthesiologist: Marcene DuosFITZGERALD, Verdia Bolt Performed: anesthesiologist   Preanesthetic Checklist Completed: patient identified, site marked, surgical consent, pre-op evaluation, timeout performed, IV checked, risks and benefits discussed and monitors and equipment checked  Epidural Patient position: sitting Prep: site prepped and draped and DuraPrep Patient monitoring: continuous pulse ox and blood pressure Approach: midline Location: L4-L5 Injection technique: LOR air  Needle:  Needle type: Tuohy  Needle gauge: 17 G Needle length: 9 cm and 9 Needle insertion depth: 7 cm Catheter type: closed end flexible Catheter size: 19 Gauge Catheter at skin depth: 14 (12--> 14cm when laid in lateral decub position.) cm Test dose: negative  Assessment Events: blood not aspirated, injection not painful, no injection resistance, negative IV test and no paresthesia

## 2016-04-08 NOTE — Progress Notes (Signed)
Post Partum Day 1 Subjective: no complaints, up ad lib, voiding and tolerating PO  Objective: Blood pressure 122/75, pulse 71, temperature 98.2 F (36.8 C), resp. rate 20, height 5\' 3"  (1.6 m), weight 208 lb (94.3 kg), last menstrual period 06/22/2015, unknown if currently breastfeeding.  Physical Exam:  General: alert, cooperative, appears stated age and no distress Lochia: appropriate Uterine Fundus: firm Incision: no dehiscence, no significant erythema DVT Evaluation: No evidence of DVT seen on physical exam.   Recent Labs  04/07/16 0810  HGB 10.6*  HCT 31.9*    Assessment/Plan: Plan for discharge tomorrow   LOS: 1 day   Anne Marshall 04/08/2016, 5:12 AM

## 2016-04-08 NOTE — Anesthesia Postprocedure Evaluation (Signed)
Anesthesia Post Note  Patient: Anne Marshall  Procedure(s) Performed: * No procedures listed *  Patient location during evaluation: Mother Baby Anesthesia Type: Epidural Level of consciousness: awake, awake and alert, oriented and patient cooperative Pain management: pain level controlled Vital Signs Assessment: post-procedure vital signs reviewed and stable Respiratory status: spontaneous breathing, nonlabored ventilation and respiratory function stable Cardiovascular status: stable Postop Assessment: no headache, no backache, patient able to bend at knees and no signs of nausea or vomiting Anesthetic complications: no        Last Vitals:  Vitals:   04/08/16 0020 04/08/16 0648  BP: 122/75 118/70  Pulse: 71 75  Resp: 20   Temp: 36.8 C 36.8 C    Last Pain:  Vitals:   04/08/16 0556  TempSrc:   PainSc: 5    Pain Goal:                 Jamiesha Victoria L

## 2016-04-09 DIAGNOSIS — Z3A41 41 weeks gestation of pregnancy: Secondary | ICD-10-CM

## 2016-04-09 MED ORDER — IBUPROFEN 600 MG PO TABS: 600.0000 mg | ORAL_TABLET | Freq: Four times a day (QID) | ORAL | 0 refills | Status: AC

## 2016-04-09 NOTE — Discharge Summary (Signed)
Obstetric Discharge Summary Reason for Admission: induction of labor Prenatal Procedures: ultrasound Intrapartum Procedures: spontaneous vaginal delivery Postpartum Procedures: none Complications-Operative and Postpartum: second degree perineal laceration Hemoglobin  Date Value Ref Range Status  04/07/2016 10.6 (L) 12.0 - 15.0 g/dL Final   HCT  Date Value Ref Range Status  04/07/2016 31.9 (L) 36.0 - 46.0 % Final   Hematocrit  Date Value Ref Range Status  01/03/2016 33.2 (L) 34.0 - 46.6 % Final    Physical Exam:  General: alert, cooperative and no distress Lochia: appropriate Uterine Fundus: firm Incision: n/a DVT Evaluation: No evidence of DVT seen on physical exam.  Discharge Diagnoses: Term Pregnancy-delivered  Discharge Information: Date: 04/09/2016 Activity: unrestricted and pelvic rest Diet: routine Medications: PNV and Ibuprofen Condition: stable Instructions: refer to practice specific booklet Discharge to: home  Contraception: undecided. Discussed LARCs as most effective.  Follow-up Information    [facility] Follow up in 6 week(s).   Contact information: [address] [place] [id number] [phone]          Newborn Data: Live born female  Birth Weight: 8 lb 5.5 oz (3785 g) APGAR: 9, 9  Home with mother.  Anne CounterLisa Leftwich-Kirby 04/09/2016, 6:14 AM

## 2016-04-09 NOTE — Discharge Instructions (Signed)

## 2016-05-24 ENCOUNTER — Ambulatory Visit: Payer: BLUE CROSS/BLUE SHIELD | Admitting: Women's Health

## 2016-05-29 ENCOUNTER — Encounter: Payer: Self-pay | Admitting: Women's Health

## 2016-05-29 ENCOUNTER — Ambulatory Visit (INDEPENDENT_AMBULATORY_CARE_PROVIDER_SITE_OTHER): Payer: BLUE CROSS/BLUE SHIELD | Admitting: Women's Health

## 2016-05-29 NOTE — Progress Notes (Signed)
Subjective:    Anne Marshall is a 31 y.o. [id number] Caucasian female who presents for a postpartum visit. She is 7 weeks postpartum following a spontaneous vaginal delivery at 41.3 gestational weeks after IOL for postdates. Anesthesia: epidural. I have fully reviewed the prenatal and intrapartum course. Postpartum course has been uncomplicated. Baby's course has been uncomplicated. Baby is feeding by bottle. Bleeding on period. Bowel function is normal. Bladder function is normal. Patient is sexually active. Last sexual activity: ~1wk ago. Contraception method is condoms and husband plans vasectomy. Postpartum depression screening: negative. Score 1.  Last pap 12/06/15 and was neg.  The following portions of the patient's history were reviewed and updated as appropriate: allergies, current medications, past medical history, past surgical history and problem list.  Review of Systems Pertinent items are noted in HPI.   Vitals:   05/29/16 1139  BP: 112/64  Pulse: 80  Weight: 187 lb (84.8 kg)  Height: 5\' 3"  (1.6 m)   Patient's last menstrual period was 05/26/2016.  Objective:   General:  alert, cooperative and no distress   Breasts:  deferred, no complaints  Lungs: clear to auscultation bilaterally  Heart:  regular rate and rhythm  Abdomen: soft, nontender   Vulva: normal  Vagina: normal vagina  Cervix:  closed  Corpus: Well-involuted  Adnexa:  Non-palpable  Rectal Exam: No hemorrhoids        Assessment:   Postpartum exam 7 wks s/p SVB after IOL for postdates Bottlefeeding Depression screening Contraception counseling   Plan:  Contraception: condoms, then husband plans vasectomy Follow up in: sept for physical, or earlier if needed  [name], [name] CNM, [facility]NP[facility]BC 05/29/2016 11:46 AM

## 2016-05-29 NOTE — Patient Instructions (Addendum)
[facility] 

## 2016-10-29 ENCOUNTER — Encounter: Payer: Self-pay | Admitting: Adult Health

## 2016-10-29 ENCOUNTER — Telehealth: Payer: Self-pay | Admitting: Obstetrics & Gynecology

## 2016-10-29 ENCOUNTER — Ambulatory Visit (INDEPENDENT_AMBULATORY_CARE_PROVIDER_SITE_OTHER): Payer: BLUE CROSS/BLUE SHIELD | Admitting: Adult Health

## 2016-10-29 VITALS — BP 90/60 | HR 80 | Ht 63.0 in | Wt 181.0 lb

## 2016-10-29 DIAGNOSIS — N920 Excessive and frequent menstruation with regular cycle: Secondary | ICD-10-CM | POA: Diagnosis not present

## 2016-10-29 DIAGNOSIS — N946 Dysmenorrhea, unspecified: Secondary | ICD-10-CM | POA: Diagnosis not present

## 2016-10-29 LAB — POCT HEMOGLOBIN: Hemoglobin: 11.4 g/dL — AB (ref 12.2–16.2)

## 2016-10-29 LAB — POCT URINE PREGNANCY: PREG TEST UR: NEGATIVE

## 2016-10-29 MED ORDER — KETOROLAC TROMETHAMINE 10 MG PO TABS: 10.0000 mg | ORAL_TABLET | Freq: Four times a day (QID) | ORAL | 0 refills | Status: AC | PRN

## 2016-10-29 NOTE — Patient Instructions (Signed)
Dysmenorrhea Menstrual cramps (dysmenorrhea) are caused by the muscles of the uterus tightening (contracting) during a menstrual period. For some women, this discomfort is merely bothersome. For others, dysmenorrhea can be severe enough to interfere with everyday activities for a few days each month. Primary dysmenorrhea is menstrual cramps that last a couple of days when you start having menstrual periods or soon after. This often begins after a teenager starts having her period. As a woman gets older or has a baby, the cramps will usually lessen or disappear. Secondary dysmenorrhea begins later in life, lasts longer, and the pain may be stronger than primary dysmenorrhea. The pain may start before the period and last a few days after the period. What are the causes? Dysmenorrhea is usually caused by an underlying problem, such as:  The tissue lining the uterus grows outside of the uterus in other areas of the body (endometriosis).  The endometrial tissue, which normally lines the uterus, is found in or grows into the muscular walls of the uterus (adenomyosis).  The pelvic blood vessels are engorged with blood just before the menstrual period (pelvic congestive syndrome).  Overgrowth of cells (polyps) in the lining of the uterus or cervix.  Falling down of the uterus (prolapse) because of loose or stretched ligaments.  Depression.  Bladder problems, infection, or inflammation.  Problems with the intestine, a tumor, or irritable bowel syndrome.  Cancer of the female organs or bladder.  A severely tipped uterus.  A very tight opening or closed cervix.  Noncancerous tumors of the uterus (fibroids).  Pelvic inflammatory disease (PID).  Pelvic scarring (adhesions) from a previous surgery.  Ovarian cyst.  An intrauterine device (IUD) used for birth control. What increases the risk? You may be at greater risk of dysmenorrhea if:  You are younger than age 30.  You started puberty  early.  You have irregular or heavy bleeding.  You have never given birth.  You have a family history of this problem.  You are a smoker. What are the signs or symptoms?  Cramping or throbbing pain in your lower abdomen.  Headaches.  Lower back pain.  Nausea or vomiting.  Diarrhea.  Sweating or dizziness.  Loose stools. How is this diagnosed? A diagnosis is based on your history, symptoms, physical exam, diagnostic tests, or procedures. Diagnostic tests or procedures may include:  Blood tests.  Ultrasonography.  An examination of the lining of the uterus (dilation and curettage, D&C).  An examination inside your abdomen or pelvis with a scope (laparoscopy).  X-rays.  CT scan.  MRI.  An examination inside the bladder with a scope (cystoscopy).  An examination inside the intestine or stomach with a scope (colonoscopy, gastroscopy). How is this treated? Treatment depends on the cause of the dysmenorrhea. Treatment may include:  Pain medicine prescribed by your health care provider.  Birth control pills or an IUD with progesterone hormone in it.  Hormone replacement therapy.  Nonsteroidal anti-inflammatory drugs (NSAIDs). These may help stop the production of prostaglandins.  Surgery to remove adhesions, endometriosis, ovarian cyst, or fibroids.  Removal of the uterus (hysterectomy).  Progesterone shots to stop the menstrual period.  Cutting the nerves on the sacrum that go to the female organs (presacral neurectomy).  Electric current to the sacral nerves (sacral nerve stimulation).  Antidepressant medicine.  Psychiatric therapy, counseling, or group therapy.  Exercise and physical therapy.  Meditation and yoga therapy.  Acupuncture. Follow these instructions at home:  Only take over-the-counter or prescription medicines as directed   by your health care provider.  Place a heating pad or hot water bottle on your lower back or abdomen. Do not  sleep with the heating pad.  Use aerobic exercises, walking, swimming, biking, and other exercises to help lessen the cramping.  Massage to the lower back or abdomen may help.  Stop smoking.  Avoid alcohol and caffeine. Contact a health care provider if:  Your pain does not get better with medicine.  You have pain with sexual intercourse.  Your pain increases and is not controlled with medicines.  You have abnormal vaginal bleeding with your period.  You develop nausea or vomiting with your period that is not controlled with medicine. Get help right away if: You pass out. This information is not intended to replace advice given to you by your health care provider. Make sure you discuss any questions you have with your health care provider. Document Released: 03/11/2005 Document Revised: 08/17/2015 Document Reviewed: 08/27/2012 Elsevier Interactive Patient Education  2017 [employer] Menorrhagia Menorrhagia is a condition in which menstrual periods are heavy or last longer than normal. With menorrhagia, most periods a woman has may cause enough blood loss and cramping that she becomes unable to take part in her usual activities. What are the causes? Common causes of this condition include:  Noncancerous growths in the uterus (polyps or fibroids).  An imbalance of the estrogen and progesterone hormones.  One of the ovaries not releasing an egg during one or more months.  A problem with the thyroid gland (hypothyroid).  Side effects of having an intrauterine device (IUD).  Side effects of some medicines, such as anti-inflammatory medicines or blood thinners.  A bleeding disorder that stops the blood from clotting normally. In some cases, the cause of this condition is not known. What are the signs or symptoms? Symptoms of this condition include:  Routinely having to change your pad or tampon every 1-2 hours because it is completely soaked.  Needing to use pads and  tampons at the same time because of heavy bleeding.  Needing to wake up to change your pads or tampons during the night.  Passing blood clots larger than 1 inch (2.5 cm) in size.  Having bleeding that lasts for more than 7 days.  Having symptoms of low iron levels (anemia), such as tiredness, fatigue, or shortness of breath. How is this diagnosed? This condition may be diagnosed based on:  A physical exam.  Your symptoms and menstrual history.  Tests, such as:  Blood tests to check if you are pregnant or have hormonal changes, a bleeding or thyroid disorder, anemia, or other problems.  Pap test to check for cancerous changes, infections, or inflammation.  Endometrial biopsy. This test involves removing a tissue sample from the lining of the uterus (endometrium) to be examined under a microscope.  Pelvic ultrasound. This test uses sound waves to create images of your uterus, ovaries, and vagina. The images can show if you have fibroids or other growths.  Hysteroscopy. For this test, a small telescope is used to look inside your uterus. How is this treated? Treatment may not be needed for this condition. If it is needed, the best treatment for you will depend on:  Whether you need to prevent pregnancy.  Your desire to have children in the future.  The cause and severity of your bleeding.  Your personal preference. Medicines are the first step in treatment. You may be treated with:  Hormonal birth control methods. These treatments reduce bleeding during   your menstrual period. They include:  Birth control pills.  Skin patch.  Vaginal ring.  Shots (injections) that you get every 3 months.  Hormonal IUD (intrauterine device).  Implants that go under the skin.  Medicines that thicken blood and slow bleeding.  Medicines that reduce swelling, such as ibuprofen.  Medicines that contain an artificial (synthetic) hormone called progestin.  Medicines that make the  ovaries stop working for a short time.  Iron supplements to treat anemia. If medicines do not work, surgery may be done. Surgical options may include:  Dilation and curettage (D&C). In this procedure, your health care provider opens (dilates) your cervix and then scrapes or suctions tissue from the endometrium to reduce menstrual bleeding.  Operative hysteroscopy. In this procedure, a small tube with a light on the end (hysteroscope) is used to view your uterus and help remove polyps that may be causing heavy periods.  Endometrial ablation. This is when various techniques are used to permanently destroy your entire endometrium. After endometrial ablation, most women have little or no menstrual flow. This procedure reduces your ability to become pregnant.  Endometrial resection. In this procedure, an electrosurgical wire loop is used to remove the endometrium. This procedure reduces your ability to become pregnant.  Hysterectomy. This is surgical removal of the uterus. This is a permanent procedure that stops menstrual periods. Pregnancy is not possible after a hysterectomy. Follow these instructions at home: Medicines   Take over-the-counter and prescription medicines exactly as told by your health care provider. This includes iron pills.  Do not change or switch medicines without asking your health care provider.  Do not take aspirin or medicines that contain aspirin 1 week before or during your menstrual period. Aspirin may make bleeding worse. General instructions   If you need to change your sanitary pad or tampon more than once every 2 hours, limit your activity until the bleeding stops.  Iron pills can cause constipation. To prevent or treat constipation while you are taking prescription iron supplements, your health care provider may recommend that you:  Drink enough fluid to keep your urine clear or pale yellow.  Take over-the-counter or prescription medicines.  Eat foods that  are high in fiber, such as fresh fruits and vegetables, whole grains, and beans.  Limit foods that are high in fat and processed sugars, such as fried and sweet foods.  Eat well-balanced meals, including foods that are high in iron. Foods that have a lot of iron include leafy green vegetables, meat, liver, eggs, and whole grain breads and cereals.  Do not try to lose weight until the abnormal bleeding has stopped and your blood iron level is back to normal. If you need to lose weight, work with your health care provider to lose weight safely.  Keep all follow-up visits as told by your health care provider. This is important. Contact a health care provider if:  You soak through a pad or tampon every 1 or 2 hours, and this happens every time you have a period.  You need to use pads and tampons at the same time because you are bleeding so much.  You have nausea, vomiting, diarrhea, or other problems related to medicines you are taking. Get help right away if:  You soak through more than a pad or tampon in 1 hour.  You pass clots bigger than 1 inch (2.5 cm) wide.  You feel short of breath.  You feel like your heart is beating too fast.  You feel   dizzy or faint.  You feel very weak or tired. Summary  Menorrhagia is a condition in which menstrual periods are heavy or last longer than normal.  Treatment will depend on the cause of the condition and may include medicines or procedures.  Take over-the-counter and prescription medicines exactly as told by your health care provider. This includes iron pills.  Get help right away if you have heavy bleeding that soaks through more than a pad or tampon in 1 hour, you are passing large clots, or you feel dizzy, faint or short of breath. This information is not intended to replace advice given to you by your health care provider. Make sure you discuss any questions you have with your health care provider. Document Released: 03/11/2005 Document  Revised: 03/04/2016 Document Reviewed: 03/04/2016 Elsevier Interactive Patient Education  2017 Elsevier Inc.  

## 2016-10-29 NOTE — Progress Notes (Signed)
Subjective:     Patient ID: Anne ItoBrittney E Marshall, female   DOB: 10/22/1985, 31 y.o.   MRN: [id number]  HPI Anne Marshall is a 31 year old white female in complaining of painful periods for last 4 months. Has 607 month old at home and is not on birth control.   Review of Systems Pain in stomach, back, and "butt" with period last 4 months Constipated with period Periods last 3- 9 days for last 4 months  Reviewed past medical,surgical, social and family history. Reviewed medications and allergies.     Objective:   Physical Exam BP 90/60 (BP Location: Left Arm, Patient Position: Sitting, Cuff Size: Small)   Pulse 80   Ht 5\' 3"  (1.6 m)   Wt 181 lb (82.1 kg)   LMP 10/27/2016   BMI 32.06 kg/m UPT negative, HGB 11.4,  Skin warm and dry.Pelvic: external genitalia is normal in appearance no lesions, vagina: +period blood,urethra has no lesions or masses noted, cervix:smooth and bulbous, no CMT, uterus: normal size, shape and contour, mildly tender, no masses felt, adnexa: no masses, right RLQtenderness noted. Bladder is non tender and no masses felt. GC/CHL obtained.    PHQ 2 score 0.Will get US and talk options after that, did mention megace or OCs.  Assessment:     1. Menorrhagia with regular cycle   2. Dysmenorrhea       Plan:     Check CBC,CMP,TSH  GC/CHL sent  Return in 2 days for GYN US  Review handout on menorrhagia and dysmenorrhea     Meds ordered this encounter  Medications  . ketorolac (TORADOL) 10 MG tablet    Sig: Take 1 tablet (10 mg total) by mouth every 6 (six) hours as needed.    Dispense:  20 tablet    Refill:  0    Order Specific Question:   Supervising Provider    Answer:   Duane LopeEURE, LUTHER H [[id number]]

## 2016-10-29 NOTE — Telephone Encounter (Signed)
Patient called with complaints of very heavy, painful periods for the past 4 months. She is having clots as well and wants to be seen but works 3rd shift. Informed patient the only thing available this week is 3:30 today with Victorino DikeJennifer. States she would take that time.

## 2016-10-30 LAB — CBC
HEMATOCRIT: 36.8 % (ref 34.0–46.6)
HEMOGLOBIN: 11.9 g/dL (ref 11.1–15.9)
MCH: 27.4 pg (ref 26.6–33.0)
MCHC: 32.3 g/dL (ref 31.5–35.7)
MCV: 85 fL (ref 79–97)
PLATELETS: 239 10*3/uL (ref 150–379)
RBC: 4.35 x10E6/uL (ref 3.77–5.28)
RDW: 15.7 % — ABNORMAL HIGH (ref 12.3–15.4)
WBC: 7.4 10*3/uL (ref 3.4–10.8)

## 2016-10-30 LAB — COMPREHENSIVE METABOLIC PANEL
A/G RATIO: 1.6 (ref 1.2–2.2)
ALK PHOS: 102 IU/L (ref 39–117)
ALT: 16 IU/L (ref 0–32)
AST: 18 IU/L (ref 0–40)
Albumin: 4.2 g/dL (ref 3.5–5.5)
BILIRUBIN TOTAL: 0.3 mg/dL (ref 0.0–1.2)
BUN / CREAT RATIO: 15 (ref 9–23)
BUN: 11 mg/dL (ref 6–20)
CO2: 21 mmol/L (ref 20–29)
Calcium: 9.1 mg/dL (ref 8.7–10.2)
Chloride: 105 mmol/L (ref 96–106)
Creatinine, Ser: 0.74 mg/dL (ref 0.57–1.00)
GFR calc non Af Amer: 109 mL/min/{1.73_m2} (ref >59)
GFR, EST AFRICAN AMERICAN: 126 mL/min/{1.73_m2} (ref >59)
GLUCOSE: 77 mg/dL (ref 65–99)
Globulin, Total: 2.7 g/dL (ref 1.5–4.5)
POTASSIUM: 4.5 mmol/L (ref 3.5–5.2)
Sodium: 141 mmol/L (ref 134–144)
TOTAL PROTEIN: 6.9 g/dL (ref 6.0–8.5)

## 2016-10-30 LAB — TSH: TSH: 3.09 u[IU]/mL (ref 0.450–4.500)

## 2016-10-30 LAB — GC/CHLAMYDIA PROBE AMP
Chlamydia trachomatis, NAA: NEGATIVE
NEISSERIA GONORRHOEAE BY PCR: NEGATIVE

## 2016-10-31 ENCOUNTER — Ambulatory Visit (INDEPENDENT_AMBULATORY_CARE_PROVIDER_SITE_OTHER): Payer: BLUE CROSS/BLUE SHIELD

## 2016-10-31 ENCOUNTER — Telehealth: Payer: Self-pay | Admitting: Adult Health

## 2016-10-31 DIAGNOSIS — N920 Excessive and frequent menstruation with regular cycle: Secondary | ICD-10-CM | POA: Diagnosis not present

## 2016-10-31 DIAGNOSIS — N946 Dysmenorrhea, unspecified: Secondary | ICD-10-CM

## 2016-10-31 MED ORDER — MEGESTROL ACETATE 40 MG PO TABS: ORAL_TABLET | ORAL | 1 refills | Status: AC

## 2016-10-31 NOTE — Telephone Encounter (Signed)
Pt aware US normal, will rx megace

## 2016-10-31 NOTE — Progress Notes (Signed)
PELVIC US TA/TV:homogeneous anteverted uterus,wnl,EEC 5.2 mm,normal ovaries bilat,small amount of fluid in the cul de sac fluid,ovaries appear mobile,no pain during ultrasound

## 2018-06-08 ENCOUNTER — Other Ambulatory Visit: Payer: Self-pay

## 2018-06-08 DIAGNOSIS — J029 Acute pharyngitis, unspecified: Secondary | ICD-10-CM | POA: Insufficient documentation

## 2018-06-08 DIAGNOSIS — Z79899 Other long term (current) drug therapy: Secondary | ICD-10-CM | POA: Insufficient documentation

## 2018-06-08 DIAGNOSIS — Z87891 Personal history of nicotine dependence: Secondary | ICD-10-CM | POA: Diagnosis not present

## 2018-06-09 ENCOUNTER — Encounter (HOSPITAL_COMMUNITY): Payer: Self-pay | Admitting: *Deleted

## 2018-06-09 ENCOUNTER — Other Ambulatory Visit: Payer: Self-pay

## 2018-06-09 ENCOUNTER — Emergency Department (HOSPITAL_COMMUNITY)
Admission: EM | Admit: 2018-06-09 | Discharge: 2018-06-09 | Disposition: A | Discharge status: Home / Self Care | Payer: BLUE CROSS/BLUE SHIELD | Attending: Emergency Medicine | Admitting: Emergency Medicine

## 2018-06-09 DIAGNOSIS — J029 Acute pharyngitis, unspecified: Secondary | ICD-10-CM

## 2018-06-09 LAB — GROUP A STREP BY PCR: Group A Strep by PCR: NOT DETECTED

## 2018-06-09 NOTE — ED Triage Notes (Signed)
Pt c/o sore throat that has gotten worse tonight; pt states it is very painful to swallow and having bilateral shoulder and neck pain

## 2018-06-09 NOTE — Discharge Instructions (Addendum)
Drink plenty of fluids.  Take ibuprofen 400 mg plus acetaminophen 650 mg 4 times a day as needed for pain.  Recheck if you are unable to swallow or if you have difficulty breathing.  Try sore throat lozenges for discomfort.

## 2018-06-09 NOTE — ED Provider Notes (Signed)
[facility] EMERGENCY DEPARTMENT Provider Note   CSN: [id number] Arrival date & time: 06/08/18  2358  Time seen 12:30 AM  History   Chief Complaint Chief Complaint  Patient presents with   Sore Throat    HPI Anne Marshall is a 33 y.o. female.     HPI patient states she has had a scratchy throat for a few days however she also has allergies.  She states tonight it got more painful and she has some achiness in her shoulders and in her neck.  She states it hurts more on the right than the left.  She denies any fever.  She states she gets sore throats once or twice a year related to sinus problems.  She also had the flu in January.  She states her last episode of strep throat was several years ago.  She denies being around anybody else who is sick.  PCP Patient, No Pcp Per   Past Medical History:  Diagnosis Date   Kidney stones 2011   Pregnant 08/01/2015    There are no active problems to display for this patient.   Past Surgical History:  Procedure Laterality Date   NO PAST SURGERIES       OB History    Gravida  2   Para  2   Term  2   Preterm      AB      Living  2     SAB      TAB      Ectopic      Multiple  0   Live Births  1            Home Medications    Prior to Admission medications   Medication Sig Start Date End Date Taking? Authorizing Provider  calcium carbonate (TUMS - DOSED IN MG ELEMENTAL CALCIUM) 500 MG chewable tablet Chew 1 tablet by mouth as needed for indigestion or heartburn.    [provider]  ferrous sulfate 325 (65 FE) MG tablet Take 1 tablet (325 mg total) by mouth 2 (two) times daily with a meal. Patient not taking: Reported on 05/29/2016 01/24/16   Cheral Marker, CNM  ibuprofen (ADVIL,MOTRIN) 600 MG tablet Take 1 tablet (600 mg total) by mouth every 6 (six) hours. 04/09/16   Leftwich-Kirby, Wilmer Floor, CNM  ketorolac (TORADOL) 10 MG tablet Take 1 tablet (10 mg total) by mouth every 6 (six) hours as  needed. 10/29/16   Adline Potter, NP  megestrol (MEGACE) 40 MG tablet Take 3 x 5 days then 2 x 5 days then 1 daily 10/31/16   Adline Potter, NP  prenatal vitamin w/FE, FA (PRENATAL 1 + 1) 27-1 MG TABS tablet Take 1 tablet by mouth daily.    [provider]    Family History Family History  Problem Relation Age of Onset   Diabetes Father    Ovarian cancer Mother     Social History Social History   Tobacco Use   Smoking status: Former Smoker    Types: Cigarettes    Last attempt to quit: 2015    Years since quitting: 5.2   Smokeless tobacco: Never Used  Substance Use Topics   Alcohol use: No   Drug use: No  employed   Allergies   Patient has no known allergies.   Review of Systems Review of Systems  All other systems reviewed and are negative.    Physical Exam Updated Vital Signs BP 112/71 (BP  Location: Left Arm)    Pulse 72    Temp 98.6 F (37 C) (Oral)    Resp 16    Ht 5\' 3"  (1.6 m)    Wt 74.4 kg    LMP 05/26/2018    SpO2 100%    BMI 29.05 kg/m   Vital signs normal    Physical Exam Vitals signs and nursing note reviewed.  Constitutional:      Appearance: Normal appearance. She is normal weight.  HENT:     Head: Normocephalic and atraumatic.     Right Ear: External ear normal.     Left Ear: External ear normal.     Nose: Nose normal.     Mouth/Throat:     Mouth: Mucous membranes are moist.     Pharynx: No oropharyngeal exudate or posterior oropharyngeal erythema.     Comments: No tonsillar enlargement Eyes:     Extraocular Movements: Extraocular movements intact.     Conjunctiva/sclera: Conjunctivae normal.     Pupils: Pupils are equal, round, and reactive to light.  Neck:     Comments: She has a few small lymph nodes in the right posterior cervical chain that are slightly larger than the few on the left side.  I think this is probably making her have the pain in the right side of her neck because she points in that  area. Cardiovascular:     Rate and Rhythm: Normal rate and regular rhythm.  Pulmonary:     Effort: Pulmonary effort is normal. No respiratory distress.  Musculoskeletal: Normal range of motion.  Lymphadenopathy:     Cervical: Cervical adenopathy present.  Skin:    General: Skin is warm and dry.     Findings: No erythema.  Neurological:     General: No focal deficit present.     Mental Status: She is alert and oriented to person, place, and time.     Cranial Nerves: No cranial nerve deficit.  Psychiatric:        Mood and Affect: Mood normal.        Behavior: Behavior normal.        Thought Content: Thought content normal.      ED Treatments / Results  Labs (all labs ordered are listed, but only abnormal results are displayed) Results for orders placed or performed during the hospital encounter of 06/09/18  Group A Strep by PCR  Result Value Ref Range   Group A Strep by PCR NOT DETECTED NOT DETECTED   Laboratory interpretation all normal    EKG None  Radiology No results found.  Procedures Procedures (including critical care time)  Medications Ordered in ED Medications - No data to display   Initial Impression / Assessment and Plan / ED Course  I have reviewed the triage vital signs and the nursing notes.  Pertinent labs & imaging results that were available during my care of the patient were reviewed by me and considered in my medical decision making (see chart for details).       Strep testing was done.  We discussed treatment if it was positive however her strep was negative.  She was given symptomatic care instructions.  Final Clinical Impressions(s) / ED Diagnoses   Final diagnoses:  Sore throat    ED Discharge Orders    None    OTC ibuprofen and acetaminophen  Plan discharge  Devoria Albe, MD, Concha Pyo, MD 06/09/18 [phone]

## 2022-03-11 ENCOUNTER — Other Ambulatory Visit: Payer: Self-pay

## 2022-03-11 ENCOUNTER — Emergency Department (HOSPITAL_BASED_OUTPATIENT_CLINIC_OR_DEPARTMENT_OTHER)
Admission: EM | Admit: 2022-03-11 | Discharge: 2022-03-11 | Disposition: A | Discharge status: Home / Self Care | Payer: BC Managed Care – PPO | Attending: Emergency Medicine | Admitting: Emergency Medicine

## 2022-03-11 DIAGNOSIS — T24131A Burn of first degree of right lower leg, initial encounter: Secondary | ICD-10-CM | POA: Diagnosis not present

## 2022-03-11 DIAGNOSIS — T24031A Burn of unspecified degree of right lower leg, initial encounter: Secondary | ICD-10-CM | POA: Diagnosis present

## 2022-03-11 DIAGNOSIS — X101XXA Contact with hot food, initial encounter: Secondary | ICD-10-CM | POA: Insufficient documentation

## 2022-03-11 MED ORDER — OXYCODONE-ACETAMINOPHEN 5-325 MG PO TABS: 1.0000 | ORAL_TABLET | Freq: Three times a day (TID) | ORAL | 0 refills | Status: AC | PRN

## 2022-03-11 MED ORDER — BACITRACIN ZINC 500 UNIT/GM EX OINT
TOPICAL_OINTMENT | Freq: Two times a day (BID) | CUTANEOUS | Status: DC
  Administered 2022-03-11: 1 via TOPICAL
  Filled 2022-03-11: qty 28.35

## 2022-03-11 MED ORDER — OXYCODONE-ACETAMINOPHEN 5-325 MG PO TABS
1.0000 | ORAL_TABLET | Freq: Once | ORAL | Status: AC
  Administered 2022-03-11: 1 via ORAL
  Filled 2022-03-11: qty 1

## 2022-03-11 MED ORDER — BACITRACIN ZINC 500 UNIT/GM EX OINT: 1.0000 | TOPICAL_OINTMENT | Freq: Two times a day (BID) | CUTANEOUS | 0 refills | Status: AC

## 2022-03-11 NOTE — ED Notes (Signed)
Non-adhesive and kerlix wrap applied

## 2022-03-11 NOTE — ED Notes (Signed)
Patient verbalizes understanding of discharge instructions. Opportunity for questioning and answers were provided. Armband removed by staff, pt discharged from ED. Ambulated out to lobby with family ? ?
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
## 2022-03-11 NOTE — ED Notes (Signed)
DR. Pickering at bedside.  
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
## 2022-03-31 NOTE — ED Provider Notes (Signed)
[facility] EMERGENCY DEPT Provider Note   CSN: [id number] Arrival date & time: 03/11/22  1654     History  Chief Complaint  Patient presents with   Burn    Anne Marshall is a 37 y.o. female.   Burn  Patient presents with a burn on her right lower leg.  Was eating couple of noodles and spilled it onto her right lower leg.  Has blister.  Some mild pain.  No other injury.   Past Medical History:  Diagnosis Date   Kidney stones 2011   Pregnant 08/01/2015    Home Medications Prior to Admission medications   Medication Sig Start Date End Date Taking? Authorizing Provider  bacitracin ointment Apply 1 Application topically 2 (two) times daily. 03/11/22  Yes Benjiman Core, MD  oxyCODONE-acetaminophen (PERCOCET/ROXICET) 5-325 MG tablet Take 1-2 tablets by mouth every 8 (eight) hours as needed for severe pain. 03/11/22  Yes Benjiman Core, MD  calcium carbonate (TUMS - DOSED IN MG ELEMENTAL CALCIUM) 500 MG chewable tablet Chew 1 tablet by mouth as needed for indigestion or heartburn.    [provider]  ferrous sulfate 325 (65 FE) MG tablet Take 1 tablet (325 mg total) by mouth 2 (two) times daily with a meal. Patient not taking: Reported on 05/29/2016 01/24/16   Cheral Marker, CNM  ibuprofen (ADVIL,MOTRIN) 600 MG tablet Take 1 tablet (600 mg total) by mouth every 6 (six) hours. 04/09/16   Leftwich-Kirby, Wilmer Floor, CNM  ketorolac (TORADOL) 10 MG tablet Take 1 tablet (10 mg total) by mouth every 6 (six) hours as needed. 10/29/16   Adline Potter, NP  megestrol (MEGACE) 40 MG tablet Take 3 x 5 days then 2 x 5 days then 1 daily 10/31/16   Adline Potter, NP  prenatal vitamin w/FE, FA (PRENATAL 1 + 1) 27-1 MG TABS tablet Take 1 tablet by mouth daily.    [provider]      Allergies    Patient has no known allergies.    Review of Systems   Review of Systems  Physical Exam Updated Vital Signs BP 117/82   Pulse 83   Temp 98.1 F (36.7  C) (Oral)   Resp 18   Wt 68 kg   LMP 02/25/2022   SpO2 100%   BMI 26.57 kg/m  Physical Exam Vitals reviewed.  Skin:    Comments: Anterior right lower leg has blistered area approximately 10 cm long.  Tender.  Neurological:     Mental Status: She is alert.     ED Results / Procedures / Treatments   Labs (all labs ordered are listed, but only abnormal results are displayed) Labs Reviewed - No data to display  EKG None  Radiology No results found.  Procedures Procedures    Medications Ordered in ED Medications  oxyCODONE-acetaminophen (PERCOCET/ROXICET) 5-325 MG per tablet 1 tablet (1 tablet Oral Given 03/11/22 2226)    ED Course/ Medical Decision Making/ A&P                           Medical Decision Making Risk OTC drugs. Prescription drug management.   Patient with burn to right lower leg.  Painful.  Not circumferential.  Will treat with pain medicines and antibiotics.  Follow-up with PCP as needed.        Final Clinical Impression(s) / ED Diagnoses Final diagnoses:  Superficial burn of right lower leg, initial encounter    Rx /  DC Orders ED Discharge Orders          Ordered    oxyCODONE-acetaminophen (PERCOCET/ROXICET) 5-325 MG tablet  Every 8 hours PRN        03/11/22 2238    bacitracin ointment  2 times daily        03/11/22 2238              Davonna Belling, MD 03/31/22 1149
